# Patient Record
Sex: Male | Born: 1950 | Race: White | Hispanic: No | Marital: Married | Smoking: Never smoker
Health system: Southern US, Community
[De-identification: ages and names within clinical notes are randomized; demographics above are authoritative.]

## PROBLEM LIST (undated history)

## (undated) DIAGNOSIS — Z9289 Personal history of other medical treatment: Secondary | ICD-10-CM

## (undated) DIAGNOSIS — E669 Obesity, unspecified: Secondary | ICD-10-CM

## (undated) DIAGNOSIS — N62 Hypertrophy of breast: Secondary | ICD-10-CM

## (undated) DIAGNOSIS — Z9889 Other specified postprocedural states: Secondary | ICD-10-CM

## (undated) DIAGNOSIS — R112 Nausea with vomiting, unspecified: Secondary | ICD-10-CM

## (undated) DIAGNOSIS — E785 Hyperlipidemia, unspecified: Secondary | ICD-10-CM

## (undated) DIAGNOSIS — J302 Other seasonal allergic rhinitis: Secondary | ICD-10-CM

## (undated) DIAGNOSIS — L0390 Cellulitis, unspecified: Secondary | ICD-10-CM

## (undated) DIAGNOSIS — B9562 Methicillin resistant Staphylococcus aureus infection as the cause of diseases classified elsewhere: Secondary | ICD-10-CM

## (undated) DIAGNOSIS — N189 Chronic kidney disease, unspecified: Secondary | ICD-10-CM

## (undated) DIAGNOSIS — K5792 Diverticulitis of intestine, part unspecified, without perforation or abscess without bleeding: Secondary | ICD-10-CM

## (undated) HISTORY — PX: BREAST REDUCTION SURGERY: SHX8

## (undated) HISTORY — PX: LITHOTRIPSY: SUR834

## (undated) HISTORY — DX: Obesity, unspecified: E66.9

## (undated) HISTORY — DX: Hyperlipidemia, unspecified: E78.5

---

## 1978-02-25 HISTORY — PX: KNEE SURGERY: SHX244

## 1987-02-26 HISTORY — PX: KIDNEY STONE SURGERY: SHX686

## 2010-03-15 ENCOUNTER — Emergency Department (HOSPITAL_BASED_OUTPATIENT_CLINIC_OR_DEPARTMENT_OTHER)
Admission: EM | Admit: 2010-03-15 | Discharge: 2010-03-15 | Payer: Self-pay | Source: Home / Self Care | Admitting: Emergency Medicine

## 2010-03-19 LAB — CBC
HCT: 45 % (ref 39.0–52.0)
Hemoglobin: 15.6 g/dL (ref 13.0–17.0)
MCH: 30.3 pg (ref 26.0–34.0)
MCHC: 34.7 g/dL (ref 30.0–36.0)
MCV: 87.4 fL (ref 78.0–100.0)
Platelets: 238 10*3/uL (ref 150–400)
RBC: 5.15 MIL/uL (ref 4.22–5.81)
RDW: 13.7 % (ref 11.5–15.5)
WBC: 9.1 10*3/uL (ref 4.0–10.5)

## 2010-03-19 LAB — COMPREHENSIVE METABOLIC PANEL WITH GFR
CO2: 27 meq/L (ref 19–32)
Calcium: 9.5 mg/dL (ref 8.4–10.5)
Creatinine, Ser: 1 mg/dL (ref 0.4–1.5)
GFR calc Af Amer: >60 mL/min (ref >60)
GFR calc non Af Amer: >60 mL/min (ref >60)
Glucose, Bld: 203 mg/dL — ABNORMAL HIGH (ref 70–99)

## 2010-03-19 LAB — URINALYSIS, ROUTINE W REFLEX MICROSCOPIC
Bilirubin Urine: NEGATIVE
Ketones, ur: 15 mg/dL — AB
Nitrite: NEGATIVE
Protein, ur: NEGATIVE mg/dL
Specific Gravity, Urine: 1.021 (ref 1.005–1.030)
Urine Glucose, Fasting: NEGATIVE mg/dL
Urobilinogen, UA: 0.2 mg/dL (ref 0.0–1.0)
pH: 5.5 (ref 5.0–8.0)

## 2010-03-19 LAB — URINE MICROSCOPIC-ADD ON

## 2010-03-19 LAB — DIFFERENTIAL
Basophils Absolute: 0 10*3/uL (ref 0.0–0.1)
Basophils Relative: 0 % (ref 0–1)
Eosinophils Absolute: 0.2 10*3/uL (ref 0.0–0.7)
Eosinophils Relative: 3 % (ref 0–5)
Lymphocytes Relative: 25 % (ref 12–46)
Lymphs Abs: 2.3 10*3/uL (ref 0.7–4.0)
Monocytes Absolute: 0.8 K/uL (ref 0.1–1.0)
Monocytes Relative: 8 % (ref 3–12)
Neutro Abs: 5.8 K/uL (ref 1.7–7.7)
Neutrophils Relative %: 64 % (ref 43–77)

## 2010-03-19 LAB — COMPREHENSIVE METABOLIC PANEL
ALT: 43 U/L (ref 0–53)
AST: 29 U/L (ref 0–37)
Albumin: 4.2 g/dL (ref 3.5–5.2)
Alkaline Phosphatase: 82 U/L (ref 39–117)
BUN: 14 mg/dL (ref 6–23)
Chloride: 107 mEq/L (ref 96–112)
Potassium: 3.7 mEq/L (ref 3.5–5.1)
Sodium: 145 mEq/L (ref 135–145)
Total Bilirubin: 0.8 mg/dL (ref 0.3–1.2)
Total Protein: 7.3 g/dL (ref 6.0–8.3)

## 2010-03-19 LAB — LIPASE, BLOOD: Lipase: 93 U/L (ref 23–300)

## 2011-08-15 ENCOUNTER — Encounter: Payer: Self-pay | Admitting: *Deleted

## 2011-08-15 ENCOUNTER — Encounter: Payer: BC Managed Care – PPO | Attending: Family Medicine | Admitting: *Deleted

## 2011-08-15 DIAGNOSIS — E119 Type 2 diabetes mellitus without complications: Secondary | ICD-10-CM | POA: Insufficient documentation

## 2011-08-15 DIAGNOSIS — Z713 Dietary counseling and surveillance: Secondary | ICD-10-CM | POA: Insufficient documentation

## 2011-08-15 NOTE — Progress Notes (Signed)
  Medical Nutrition Therapy:  Appt start time: 1400 end time:  1500.   Assessment:  Primary concerns today: diabetes.   MEDICATIONS: see list   DIETARY INTAKE:  Usual eating pattern includes 1-3 meals and 3 snacks per day.  Everyday foods include pasta, bread, vegetables, nuts.  Avoided foods include sugary beverages.    24-hr recall:  B ( AM): skips 3 days; eats oatmeal, eggs, bacon, toast, hash browns, tomatoes, corn chex, yogurt and museli with berries or other fruit; coffee with dairy creamer with NNS. No more juice  Snk ( AM): nuts or candy or granola bars with water L ( PM): skips most day; may have burger and fries Snk ( PM): nuts or fruit D ( PM): pasta with meat sauce or pasta primavera with salad; roasted sweet potatoes; chicken breast with salad; rarely steak with salad; or crusty bread with salad Snk ( PM): ice cream or 2 posicles more nuts and chocolate; coffee with creamer Beverages: mineral water  Usual physical activity: none currently  Estimated energy needs: 1800 calories 200 g carbohydrates 135 g protein 50 g fat  Progress Towards Goal(s):  In progress.   Nutritional Diagnosis:  NB-1.6 Limited adherence to nutrition-related recommendations related to skipping meals and refined carbohydrates consumption.  As evidenced by obesity and HgA1C of 6.7%.    Intervention:  Nutrition counseling provided.  Patient is here with diabetes.  Has been prediabetic for awhile, but admits to not following nutrition advice and believing that he would not ever develop diabetes.  Discussed physiology of diabetes, disease progression, and role of obesity on insulin resistance.  Patient admits to not taking prescribed metformin; discussed action of metformin and it's effectiveness on glucose control.  Encouraged patient to take medications as prescribed.  Discussed carb counting and portion control.  Also discussed reading food labels, limiting dietary fats, and increasing physical  activity.  Also discussed GBM and encouraged patient to talk with physician about getting prescription for glucose strips/lancets.  Handouts given during visit include: Carb Counting and Food Label handouts Meal Plan Card  Goals: Follow Diabetes Meal Plan as instructed Eat 3 meals and 2 snacks, every 3-5 hrs Limit carbohydrate intake to 45 grams carbohydrate/meal Limit carbohydrate intake to 15 grams carbohydrate/snack Add lean protein foods to meals/snacks Talk with doctor about monitoring blood glucose Aim for 15 mins of physical activity daily Bring food record and glucose log to your next nutrition visit  Monitoring/Evaluation:  Dietary intake, exercise, GBM, and body weight in 1 month(s).  (do diabetes assessment tool)

## 2011-08-15 NOTE — Patient Instructions (Addendum)
Goals:  Follow Diabetes Meal Plan as instructed  Eat 3 meals and 2 snacks, every 3-5 hrs  Limit carbohydrate intake to 45 grams carbohydrate/meal  Limit carbohydrate intake to 15 grams carbohydrate/snack  Add lean protein foods to meals/snacks  Monitor glucose levels as instructed by your doctor  Aim for 15 mins of physical activity daily  Bring food record and glucose log to your next nutrition visit

## 2011-09-02 ENCOUNTER — Encounter (HOSPITAL_COMMUNITY): Payer: Self-pay | Admitting: Pharmacy Technician

## 2011-09-09 ENCOUNTER — Encounter (HOSPITAL_COMMUNITY)
Admission: RE | Admit: 2011-09-09 | Discharge: 2011-09-09 | Disposition: A | Discharge status: Home / Self Care | Payer: BC Managed Care – PPO | Source: Ambulatory Visit | Attending: Orthopedic Surgery | Admitting: Orthopedic Surgery

## 2011-09-09 ENCOUNTER — Encounter (HOSPITAL_COMMUNITY): Payer: Self-pay

## 2011-09-09 HISTORY — DX: Diverticulitis of intestine, part unspecified, without perforation or abscess without bleeding: K57.92

## 2011-09-09 HISTORY — DX: Cellulitis, unspecified: L03.90

## 2011-09-09 HISTORY — DX: Hypertrophy of breast: N62

## 2011-09-09 HISTORY — DX: Nausea with vomiting, unspecified: R11.2

## 2011-09-09 HISTORY — DX: Chronic kidney disease, unspecified: N18.9

## 2011-09-09 HISTORY — DX: Methicillin resistant Staphylococcus aureus infection as the cause of diseases classified elsewhere: B95.62

## 2011-09-09 HISTORY — DX: Personal history of other medical treatment: Z92.89

## 2011-09-09 HISTORY — DX: Other seasonal allergic rhinitis: J30.2

## 2011-09-09 HISTORY — DX: Other specified postprocedural states: Z98.890

## 2011-09-09 LAB — CBC
HCT: 43.8 % (ref 39.0–52.0)
Hemoglobin: 14.8 g/dL (ref 13.0–17.0)
MCH: 31.5 pg (ref 26.0–34.0)
MCHC: 33.8 g/dL (ref 30.0–36.0)
MCV: 93.2 fL (ref 78.0–100.0)
RBC: 4.7 MIL/uL (ref 4.22–5.81)

## 2011-09-09 LAB — BASIC METABOLIC PANEL
BUN: 16 mg/dL (ref 6–23)
CO2: 30 mEq/L (ref 19–32)
Calcium: 9.9 mg/dL (ref 8.4–10.5)
Creatinine, Ser: 0.79 mg/dL (ref 0.50–1.35)
GFR calc non Af Amer: >90 mL/min (ref >90)
Glucose, Bld: 104 mg/dL — ABNORMAL HIGH (ref 70–99)

## 2011-09-09 LAB — SURGICAL PCR SCREEN: MRSA, PCR: NEGATIVE

## 2011-09-09 LAB — ABO/RH: ABO/RH(D): O POS

## 2011-09-09 LAB — TYPE AND SCREEN: ABO/RH(D): O POS

## 2011-09-09 NOTE — Pre-Procedure Instructions (Addendum)
[name]  09/09/2011   Your procedure is scheduled on: Friday, July 19th.  Report to [facility] at 5:30 AM.  Call this number if you have problems the morning of surgery: [phone]   Remember:  Do not eat food:After Midnight.   Take these medicines the morning of surgery with A SIP OF WATER: May take Hydrocodone- Acetaminophern (Vicodin) if needed.   Do not wear jewelry, make-up or nail polish.  Do not wear lotions, powders, or perfumes. You may wear deodorant.  Do not shave 48 hours prior to surgery. Men may shave face and neck.  Do not bring valuables to the hospital.  Contacts, dentures or bridgework may not be worn into surgery.  Leave suitcase in the car. After surgery it may be brought to your room.  For patients admitted to the hospital, checkout time is 11:00 AM the day of discharge.   Patients discharged the day of surgery will not be allowed to drive home.  Name and phone number of your driver: NA  Special Instructions: Incentive Spirometry - Practice and bring it with you on the day of surgery. and CHG Shower Use Special Wash: 1/2 bottle night before surgery and 1/2 bottle morning of surgery.   Please read over the following fact sheets that you were given: Pain Booklet, Coughing and Deep Breathing, Blood Transfusion Information, Total Joint Packet and Surgical Site Infection Prevention

## 2011-09-10 NOTE — H&P (Signed)
CC: left knee pain HPI: [name]   With worsening left knee pain secondary to osteoarthritis. Patient has elected to have a total knee arthroplasty to decrease pain and increase function. ZOX:WRUEAVWU, hyperlipidemia Family History:non contributory Social:Dr. Clydene Pugh, non smoker, non drinker Meds:norco Allergies: NKDA ROS: Pain with ambulation Vitals:128/78 74 14 PE: Alert and appropriate 61 y/o male    In no acute distress Cranial 2-12 grossly intact Cervical spine full rom without pain Lungs: bilateral breath sounds with no wheeze rhonchi or rales Heart: regular rate and rhythm with no murmur Abd: nontender nondistended with active bowel sounds Ext: moderate pain with rom of left knee with crepitus, antalgic gait, neurovascularly intact distally. No pedal edema Skin: no rashes X-rays: endstage osteoarthritis to left knee A/P: endstage osteoarthritis to left knee Plan for total knee arthroplasty to decrease pain and increase function.

## 2011-09-12 MED ORDER — CEFAZOLIN SODIUM-DEXTROSE 2-3 GM-% IV SOLR
2.0000 g | INTRAVENOUS | Status: AC
  Administered 2011-09-13: 2 g via INTRAVENOUS
  Filled 2011-09-12: qty 50

## 2011-09-12 MED ORDER — CHLORHEXIDINE GLUCONATE 4 % EX LIQD: 60.0000 mL | Freq: Once | CUTANEOUS | Status: DC

## 2011-09-13 ENCOUNTER — Encounter (HOSPITAL_COMMUNITY): Payer: Self-pay | Admitting: *Deleted

## 2011-09-13 ENCOUNTER — Encounter (HOSPITAL_COMMUNITY)
Admission: RE | Disposition: A | Discharge status: Home Health | Payer: Self-pay | Source: Ambulatory Visit | Attending: Orthopedic Surgery

## 2011-09-13 ENCOUNTER — Ambulatory Visit (HOSPITAL_COMMUNITY): Payer: BC Managed Care – PPO | Admitting: Anesthesiology

## 2011-09-13 ENCOUNTER — Ambulatory Visit (HOSPITAL_COMMUNITY): Payer: BC Managed Care – PPO

## 2011-09-13 ENCOUNTER — Inpatient Hospital Stay (HOSPITAL_COMMUNITY): Payer: BC Managed Care – PPO

## 2011-09-13 ENCOUNTER — Encounter (HOSPITAL_COMMUNITY): Payer: Self-pay | Admitting: Anesthesiology

## 2011-09-13 ENCOUNTER — Inpatient Hospital Stay (HOSPITAL_COMMUNITY)
Admission: RE | Admit: 2011-09-13 | Discharge: 2011-09-16 | DRG: 209 | Disposition: A | Discharge status: Home Health | Payer: BC Managed Care – PPO | Source: Ambulatory Visit | Attending: Orthopedic Surgery | Admitting: Orthopedic Surgery

## 2011-09-13 DIAGNOSIS — Z96659 Presence of unspecified artificial knee joint: Secondary | ICD-10-CM

## 2011-09-13 DIAGNOSIS — M1710 Unilateral primary osteoarthritis, unspecified knee: Principal | ICD-10-CM | POA: Diagnosis present

## 2011-09-13 DIAGNOSIS — E119 Type 2 diabetes mellitus without complications: Secondary | ICD-10-CM | POA: Diagnosis present

## 2011-09-13 DIAGNOSIS — E785 Hyperlipidemia, unspecified: Secondary | ICD-10-CM | POA: Diagnosis present

## 2011-09-13 HISTORY — PX: TOTAL KNEE ARTHROPLASTY: SHX125

## 2011-09-13 LAB — APTT: aPTT: 30 seconds (ref 24–37)

## 2011-09-13 LAB — GLUCOSE, CAPILLARY
Glucose-Capillary: 141 mg/dL — ABNORMAL HIGH (ref 70–99)
Glucose-Capillary: 135 mg/dL — ABNORMAL HIGH (ref 70–99)
Glucose-Capillary: 137 mg/dL — ABNORMAL HIGH (ref 70–99)

## 2011-09-13 SURGERY — ARTHROPLASTY, KNEE, TOTAL
Anesthesia: Regional | Site: Knee | Laterality: Left | Wound class: Clean

## 2011-09-13 MED ORDER — LACTATED RINGERS IV SOLN
INTRAVENOUS | Status: DC | PRN
  Administered 2011-09-13 (×2): via INTRAVENOUS

## 2011-09-13 MED ORDER — CEFAZOLIN SODIUM-DEXTROSE 2-3 GM-% IV SOLR
2.0000 g | Freq: Four times a day (QID) | INTRAVENOUS | Status: AC
  Administered 2011-09-13 (×2): 2 g via INTRAVENOUS
  Filled 2011-09-13 (×3): qty 50

## 2011-09-13 MED ORDER — ACETAMINOPHEN 325 MG PO TABS
650.0000 mg | ORAL_TABLET | Freq: Four times a day (QID) | ORAL | Status: DC | PRN
  Administered 2011-09-16 (×2): 650 mg via ORAL
  Filled 2011-09-13 (×3): qty 2

## 2011-09-13 MED ORDER — LIDOCAINE HCL (CARDIAC) 20 MG/ML IV SOLN
INTRAVENOUS | Status: DC | PRN
  Administered 2011-09-13: 30 mg via INTRAVENOUS

## 2011-09-13 MED ORDER — MENTHOL 3 MG MT LOZG: 1.0000 | LOZENGE | OROMUCOSAL | Status: DC | PRN

## 2011-09-13 MED ORDER — FENTANYL CITRATE 0.05 MG/ML IJ SOLN
50.0000 ug | INTRAMUSCULAR | Status: DC | PRN
  Administered 2011-09-13: 100 ug via INTRAVENOUS

## 2011-09-13 MED ORDER — TESTOSTERONE 50 MG/5GM (1%) TD GEL: 5.0000 g | Freq: Every day | TRANSDERMAL | Status: DC

## 2011-09-13 MED ORDER — ALBUTEROL SULFATE HFA 108 (90 BASE) MCG/ACT IN AERS
2.0000 | INHALATION_SPRAY | Freq: Four times a day (QID) | RESPIRATORY_TRACT | Status: DC | PRN
  Filled 2011-09-13: qty 6.7

## 2011-09-13 MED ORDER — CELECOXIB 200 MG PO CAPS
200.0000 mg | ORAL_CAPSULE | Freq: Two times a day (BID) | ORAL | Status: DC
  Administered 2011-09-13 – 2011-09-16 (×6): 200 mg via ORAL
  Filled 2011-09-13 (×8): qty 1

## 2011-09-13 MED ORDER — SCOPOLAMINE 1 MG/3DAYS TD PT72
1.0000 | MEDICATED_PATCH | TRANSDERMAL | Status: DC
  Administered 2011-09-13: 1.5 mg via TRANSDERMAL

## 2011-09-13 MED ORDER — HYDROMORPHONE HCL PF 1 MG/ML IJ SOLN
0.2500 mg | INTRAMUSCULAR | Status: DC | PRN
  Administered 2011-09-13 (×4): 0.5 mg via INTRAVENOUS

## 2011-09-13 MED ORDER — METHOCARBAMOL 500 MG PO TABS
500.0000 mg | ORAL_TABLET | Freq: Four times a day (QID) | ORAL | Status: DC | PRN
  Administered 2011-09-13 – 2011-09-16 (×10): 500 mg via ORAL
  Filled 2011-09-13 (×11): qty 1

## 2011-09-13 MED ORDER — ACETAMINOPHEN 650 MG RE SUPP: 650.0000 mg | Freq: Four times a day (QID) | RECTAL | Status: DC | PRN

## 2011-09-13 MED ORDER — BUPIVACAINE-EPINEPHRINE PF 0.5-1:200000 % IJ SOLN
INTRAMUSCULAR | Status: DC | PRN
  Administered 2011-09-13: 30 mL

## 2011-09-13 MED ORDER — WARFARIN VIDEO
Freq: Once | Status: AC
  Administered 2011-09-14: 08:00:00

## 2011-09-13 MED ORDER — MIDAZOLAM HCL 2 MG/2ML IJ SOLN: 0.5000 mg | Freq: Once | INTRAMUSCULAR | Status: DC | PRN

## 2011-09-13 MED ORDER — WARFARIN SODIUM 7.5 MG PO TABS
7.5000 mg | ORAL_TABLET | Freq: Once | ORAL | Status: AC
  Administered 2011-09-13: 7.5 mg via ORAL
  Filled 2011-09-13: qty 1

## 2011-09-13 MED ORDER — ONDANSETRON HCL 4 MG PO TABS: 4.0000 mg | ORAL_TABLET | Freq: Four times a day (QID) | ORAL | Status: DC | PRN

## 2011-09-13 MED ORDER — FENTANYL CITRATE 0.05 MG/ML IJ SOLN
INTRAMUSCULAR | Status: DC | PRN
  Administered 2011-09-13: 50 ug via INTRAVENOUS
  Administered 2011-09-13: 100 ug via INTRAVENOUS
  Administered 2011-09-13: 50 ug via INTRAVENOUS
  Administered 2011-09-13: 100 ug via INTRAVENOUS

## 2011-09-13 MED ORDER — LACTATED RINGERS IV SOLN
INTRAVENOUS | Status: DC
  Administered 2011-09-13: 10:00:00 via INTRAVENOUS

## 2011-09-13 MED ORDER — WARFARIN - PHARMACIST DOSING INPATIENT: Freq: Every day | Status: DC

## 2011-09-13 MED ORDER — POLYVINYL ALCOHOL 1.4 % OP SOLN
2.0000 [drp] | Freq: Three times a day (TID) | OPHTHALMIC | Status: DC
  Administered 2011-09-13 – 2011-09-16 (×6): 2 [drp] via OPHTHALMIC
  Filled 2011-09-13: qty 15

## 2011-09-13 MED ORDER — MIDAZOLAM HCL 2 MG/2ML IJ SOLN
1.0000 mg | INTRAMUSCULAR | Status: DC | PRN
  Administered 2011-09-13: 2 mg via INTRAVENOUS

## 2011-09-13 MED ORDER — PROMETHAZINE HCL 25 MG/ML IJ SOLN: 6.2500 mg | INTRAMUSCULAR | Status: DC | PRN

## 2011-09-13 MED ORDER — SODIUM CHLORIDE 0.9 % IR SOLN
Status: DC | PRN
  Administered 2011-09-13: 4000 mL

## 2011-09-13 MED ORDER — MEPERIDINE HCL 25 MG/ML IJ SOLN: 6.2500 mg | INTRAMUSCULAR | Status: DC | PRN

## 2011-09-13 MED ORDER — ONDANSETRON HCL 4 MG/2ML IJ SOLN: 4.0000 mg | Freq: Four times a day (QID) | INTRAMUSCULAR | Status: DC | PRN

## 2011-09-13 MED ORDER — HYDROMORPHONE HCL PF 1 MG/ML IJ SOLN
INTRAMUSCULAR | Status: AC
  Filled 2011-09-13: qty 1

## 2011-09-13 MED ORDER — METOCLOPRAMIDE HCL 10 MG PO TABS: 5.0000 mg | ORAL_TABLET | Freq: Three times a day (TID) | ORAL | Status: DC | PRN

## 2011-09-13 MED ORDER — PHENOL 1.4 % MT LIQD: 1.0000 | OROMUCOSAL | Status: DC | PRN

## 2011-09-13 MED ORDER — METOPROLOL TARTRATE 1 MG/ML IV SOLN
INTRAVENOUS | Status: DC | PRN
  Administered 2011-09-13 (×5): 1 mg via INTRAVENOUS

## 2011-09-13 MED ORDER — ONDANSETRON HCL 4 MG/2ML IJ SOLN
INTRAMUSCULAR | Status: DC | PRN
  Administered 2011-09-13: 4 mg via INTRAVENOUS

## 2011-09-13 MED ORDER — METHOCARBAMOL 100 MG/ML IJ SOLN
500.0000 mg | Freq: Four times a day (QID) | INTRAVENOUS | Status: DC | PRN
  Filled 2011-09-13: qty 5

## 2011-09-13 MED ORDER — PROPOFOL 10 MG/ML IV EMUL
INTRAVENOUS | Status: DC | PRN
  Administered 2011-09-13: 200 mg via INTRAVENOUS

## 2011-09-13 MED ORDER — PATIENT'S GUIDE TO USING COUMADIN BOOK
Freq: Once | Status: AC
  Administered 2011-09-13: 1
  Filled 2011-09-13: qty 1

## 2011-09-13 MED ORDER — POLYETHYL GLYCOL-PROPYL GLYCOL 0.4-0.3 % OP SOLN: 2.0000 [drp] | Freq: Three times a day (TID) | OPHTHALMIC | Status: DC

## 2011-09-13 MED ORDER — FENTANYL CITRATE 0.05 MG/ML IJ SOLN
INTRAMUSCULAR | Status: AC
  Filled 2011-09-13: qty 2

## 2011-09-13 MED ORDER — METOCLOPRAMIDE HCL 5 MG/ML IJ SOLN: 5.0000 mg | Freq: Three times a day (TID) | INTRAMUSCULAR | Status: DC | PRN

## 2011-09-13 MED ORDER — BISACODYL 10 MG RE SUPP: 10.0000 mg | Freq: Every day | RECTAL | Status: DC | PRN

## 2011-09-13 MED ORDER — MIDAZOLAM HCL 2 MG/2ML IJ SOLN
INTRAMUSCULAR | Status: AC
  Filled 2011-09-13: qty 2

## 2011-09-13 MED ORDER — OXYCODONE HCL 5 MG PO TABS
5.0000 mg | ORAL_TABLET | ORAL | Status: DC | PRN
  Administered 2011-09-13 – 2011-09-16 (×18): 10 mg via ORAL
  Filled 2011-09-13 (×19): qty 2

## 2011-09-13 MED ORDER — MIDAZOLAM HCL 5 MG/5ML IJ SOLN
INTRAMUSCULAR | Status: DC | PRN
  Administered 2011-09-13: 2 mg via INTRAVENOUS

## 2011-09-13 MED ORDER — HYDROMORPHONE HCL PF 1 MG/ML IJ SOLN
1.0000 mg | INTRAMUSCULAR | Status: DC | PRN
  Administered 2011-09-13 – 2011-09-14 (×2): 1 mg via INTRAVENOUS
  Filled 2011-09-13 (×2): qty 1

## 2011-09-13 SURGICAL SUPPLY — 60 items
BANDAGE ELASTIC 4 VELCRO ST LF (GAUZE/BANDAGES/DRESSINGS) IMPLANT
BANDAGE ESMARK 6X9 LF (GAUZE/BANDAGES/DRESSINGS) ×1 IMPLANT
BANDAGE GAUZE ELAST BULKY 4 IN (GAUZE/BANDAGES/DRESSINGS) ×2 IMPLANT
BLADE SAG 18X100X1.27 (BLADE) ×2 IMPLANT
BLADE SAW SGTL 13.0X1.19X90.0M (BLADE) ×2 IMPLANT
BNDG ELASTIC 6X10 VLCR STRL LF (GAUZE/BANDAGES/DRESSINGS) IMPLANT
BNDG ESMARK 6X9 LF (GAUZE/BANDAGES/DRESSINGS) ×2
BOWL SMART MIX CTS (DISPOSABLE) ×2 IMPLANT
CEMENT HV SMART SET (Cement) ×4 IMPLANT
CLOTH BEACON ORANGE TIMEOUT ST (SAFETY) ×2 IMPLANT
CLSR STERI-STRIP ANTIMIC 1/2X4 (GAUZE/BANDAGES/DRESSINGS) ×4 IMPLANT
COVER BACK TABLE 24X17X13 BIG (DRAPES) IMPLANT
COVER SURGICAL LIGHT HANDLE (MISCELLANEOUS) ×2 IMPLANT
CUFF TOURNIQUET SINGLE 34IN LL (TOURNIQUET CUFF) ×2 IMPLANT
CUFF TOURNIQUET SINGLE 44IN (TOURNIQUET CUFF) IMPLANT
DRAPE EXTREMITY T 121X128X90 (DRAPE) ×2 IMPLANT
DRAPE PROXIMA HALF (DRAPES) ×2 IMPLANT
DRAPE U-SHAPE 47X51 STRL (DRAPES) ×2 IMPLANT
DRSG ADAPTIC 3X8 NADH LF (GAUZE/BANDAGES/DRESSINGS) IMPLANT
DRSG PAD ABDOMINAL 8X10 ST (GAUZE/BANDAGES/DRESSINGS) ×2 IMPLANT
DURAPREP 26ML APPLICATOR (WOUND CARE) ×2 IMPLANT
ELECT CAUTERY BLADE 6.4 (BLADE) ×2 IMPLANT
ELECT REM PT RETURN 9FT ADLT (ELECTROSURGICAL) ×2
ELECTRODE REM PT RTRN 9FT ADLT (ELECTROSURGICAL) ×1 IMPLANT
FACESHIELD LNG OPTICON STERILE (SAFETY) IMPLANT
GLOVE BIOGEL PI ORTHO PRO 7.5 (GLOVE) ×1
GLOVE BIOGEL PI ORTHO PRO SZ8 (GLOVE) ×1
GLOVE EUDERMIC 7 POWDERFREE (GLOVE) ×2 IMPLANT
GLOVE ORTHO TXT STRL SZ7.5 (GLOVE) ×2 IMPLANT
GLOVE PI ORTHO PRO STRL 7.5 (GLOVE) ×1 IMPLANT
GLOVE PI ORTHO PRO STRL SZ8 (GLOVE) ×1 IMPLANT
GLOVE SURG ORTHO 8.5 STRL (GLOVE) ×2 IMPLANT
GLOVE SURG SS PI 7.0 STRL IVOR (GLOVE) ×2 IMPLANT
GOWN STRL NON-REIN LRG LVL3 (GOWN DISPOSABLE) ×2 IMPLANT
GOWN STRL REIN 2XL LVL4 (GOWN DISPOSABLE) ×2 IMPLANT
GOWN STRL REIN XL XLG (GOWN DISPOSABLE) ×6 IMPLANT
HANDPIECE INTERPULSE COAX TIP (DISPOSABLE) ×1
IMMOBILIZER KNEE 22 UNIV (SOFTGOODS) ×2 IMPLANT
KIT BASIN OR (CUSTOM PROCEDURE TRAY) ×2 IMPLANT
KIT MANIFOLD (MISCELLANEOUS) ×2 IMPLANT
KIT ROOM TURNOVER OR (KITS) ×2 IMPLANT
MANIFOLD NEPTUNE II (INSTRUMENTS) ×2 IMPLANT
NS IRRIG 1000ML POUR BTL (IV SOLUTION) ×2 IMPLANT
PACK TOTAL JOINT (CUSTOM PROCEDURE TRAY) ×2 IMPLANT
PAD ARMBOARD 7.5X6 YLW CONV (MISCELLANEOUS) ×2 IMPLANT
SET HNDPC FAN SPRY TIP SCT (DISPOSABLE) ×1 IMPLANT
SPONGE GAUZE 4X4 12PLY (GAUZE/BANDAGES/DRESSINGS) IMPLANT
STRIP CLOSURE SKIN 1/2X4 (GAUZE/BANDAGES/DRESSINGS) IMPLANT
SUCTION FRAZIER TIP 10 FR DISP (SUCTIONS) ×2 IMPLANT
SUT MNCRL AB 3-0 PS2 18 (SUTURE) ×2 IMPLANT
SUT VIC AB 0 CT1 27 (SUTURE) ×2
SUT VIC AB 0 CT1 27XBRD ANBCTR (SUTURE) ×2 IMPLANT
SUT VIC AB 1 CT1 27 (SUTURE) ×2
SUT VIC AB 1 CT1 27XBRD ANBCTR (SUTURE) ×2 IMPLANT
SUT VIC AB 2-0 CT1 27 (SUTURE) ×2
SUT VIC AB 2-0 CT1 TAPERPNT 27 (SUTURE) ×2 IMPLANT
TOWEL OR 17X24 6PK STRL BLUE (TOWEL DISPOSABLE) ×2 IMPLANT
TOWEL OR 17X26 10 PK STRL BLUE (TOWEL DISPOSABLE) ×2 IMPLANT
TRAY FOLEY CATH 14FR (SET/KITS/TRAYS/PACK) ×2 IMPLANT
WATER STERILE IRR 1000ML POUR (IV SOLUTION) ×4 IMPLANT

## 2011-09-13 NOTE — Transfer of Care (Signed)
Immediate Anesthesia Transfer of Care Note  Patient: Steven Schneider  Procedure(s) Performed: Procedure(s) (LRB): TOTAL KNEE ARTHROPLASTY (Left)  Patient Location: PACU  Anesthesia Type: General  Level of Consciousness: awake, alert  and oriented  Airway & Oxygen Therapy: Patient Spontanous Breathing and Patient connected to nasal cannula oxygen  Post-op Assessment: Report given to PACU RN, Post -op Vital signs reviewed and stable and Patient moving all extremities  Post vital signs: Reviewed and stable  Complications: No apparent anesthesia complications

## 2011-09-13 NOTE — Progress Notes (Signed)
UR COMPLETED  

## 2011-09-13 NOTE — Interval H&P Note (Signed)
History and Physical Interval Note:  09/13/2011 9:39 AM  Steven Schneider  has presented today for surgery, with the diagnosis of LEFT KNEE OA  The various methods of treatment have been discussed with the patient and family. After consideration of risks, benefits and other options for treatment, the patient has consented to  Procedure(s) (LRB): TOTAL KNEE ARTHROPLASTY (Left) as a surgical intervention .  The patient's history has been reviewed, patient examined, no change in status, stable for surgery.  I have reviewed the patient's chart and labs.  Questions were answered to the patient's satisfaction.     Steven Schneider,STEVEN R

## 2011-09-13 NOTE — Preoperative (Signed)
Beta Blockers   Reason not to administer Beta Blockers:Not Applicable 

## 2011-09-13 NOTE — Anesthesia Preprocedure Evaluation (Signed)
Anesthesia Evaluation  Patient identified by MRN, date of birth, ID band Patient awake    Reviewed: Allergy & Precautions, H&P , NPO status , Patient's Chart, lab work & pertinent test results  History of Anesthesia Complications (+) PONV  Airway Mallampati: II TM Distance: <3 FB Neck ROM: Full    Dental  (+) Teeth Intact, Caps and Dental Advisory Given   Pulmonary asthma (last needed inhaler 6 months ago) , sleep apnea (possibly? 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   Dental advisory given  Plan Discussed with: CRNA and Surgeon  Anesthesia Plan Comments: (Plan routine monitors, GA- LMA OK, femoral nerve block for post op analgesia)        Anesthesia Quick Evaluation

## 2011-09-13 NOTE — Progress Notes (Signed)
ANTICOAGULATION CONSULT NOTE - Initial Consult  Pharmacy Consult for coumadin Indication: VTE prophylaxis  No Known Allergies  Patient Measurements: Height: 5' 8.11" (173 cm) Weight: 262 lb 5.6 oz (119 kg) IBW/kg (Calculated) : 68.65  Heparin Dosing Weight:   Vital Signs: Temp: 97.4 F (36.3 C) (07/19 1533) Temp src: Oral (07/19 0828) BP: 111/80 mmHg (07/19 1533) Pulse Rate: 96  (07/19 1533)  Labs:  Basename 09/13/11 0850  HGB --  HCT --  PLT --  APTT 30  LABPROT 12.6  INR 0.92  HEPARINUNFRC --  CREATININE --  CKTOTAL --  CKMB --  TROPONINI --    Estimated Creatinine Clearance: 123.3 ml/min (by C-G formula based on Cr of 0.79).   Medical History: Past Medical History  Diagnosis Date  . Diabetes mellitus   . Hyperlipidemia   . Obesity   . PONV (postoperative nausea and vomiting)   . Chronic kidney disease     Kidney Stones- one in Left kidney now.  . Diverticulitis   . History of blood transfusion   . MRSA cellulitis   . Gynecomastia   . Seasonal allergies     Medications:  Scheduled:    .  ceFAZolin (ANCEF) IV  2 g Intravenous 60 min Pre-Op  .  ceFAZolin (ANCEF) IV  2 g Intravenous Q6H  . celecoxib  200 mg Oral Q12H  . HYDROmorphone      . HYDROmorphone      . polyvinyl alcohol  2 drop Both Eyes TID  . testosterone  5 g Transdermal Daily  . DISCONTD: chlorhexidine  60 mL Topical Once  . DISCONTD: Polyethyl Glycol-Propyl Glycol  2 drop Both Eyes TID  . DISCONTD: scopolamine  1 patch Transdermal Q72H    Assessment: 60 YOM s/p Left TKA on 7/19.  Orders to start warfarin for VTE prophylaxis, Baseline labs reveal INR=0.92, Hgb=14.8, platelets=222  Goal of Therapy:  INR 2-3 Monitor platelets by anticoagulation protocol: Yes   Plan:  1. Coumadin 7.5mg  PO x 1 tonight 2. Daily INR 3. Monitor for bleeding 4. Coumadin education materials  Dannielle Huh 09/13/2011,4:10 PM

## 2011-09-13 NOTE — Progress Notes (Signed)
Orthopedic Tech Progress Note Patient Details:  Steven Schneider [birth year] [id number] Applied overhead frame and trapeze bar. CPM Left Knee CPM Left Knee: On Left Knee Flexion (Degrees): 60  Left Knee Extension (Degrees): 0    Jennye Moccasin 09/13/2011, 2:54 PM

## 2011-09-13 NOTE — Anesthesia Procedure Notes (Addendum)
Anesthesia Regional Block:  Femoral nerve block  Pre-Anesthetic Checklist: ,, timeout performed, Correct Patient, Correct Site, Correct Laterality, Correct Procedure, Correct Position, site marked, Risks and benefits discussed,  Surgical consent,  Pre-op evaluation,  At surgeon's request and post-op pain management  Laterality: Left  Prep: chloraprep       Needles:  Injection technique: Single-shot  Needle Type: Stimulator Needle - 40     Needle Length: 4cm  Needle Gauge: 22 and 22 G    Additional Needles:  Procedures: nerve stimulator Femoral nerve block  Nerve Stimulator or Paresthesia:  Response: patella twitch, 0.45 mA, 0.1 ms,   Additional Responses:   Narrative:  Start time: 09/13/2011 9:49 AM End time: 09/13/2011 9:53 AM Injection made incrementally with aspirations every 5 mL.  Performed by: Personally  Anesthesiologist: Sandford Craze, MD  Additional Notes: Pt identified in Holding room.  Monitors applied. Working IV access confirmed. Sterile prep L knee.  #22ga PNS to patella twitch at 0.89mA threshold.  30cc 0.5% Bupivacaine with 1:200k epi injected incrementally after negative test dose.  Patient asymptomatic, VSS, no heme aspirated, tolerated well.   Sandford Craze, MD   Procedure Name: LMA Insertion Date/Time: 09/13/2011 10:27 AM Performed by: Rossie Muskrat L Pre-anesthesia Checklist: Patient identified, Timeout performed, Emergency Drugs available, Suction available and Patient being monitored Patient Re-evaluated:Patient Re-evaluated prior to inductionOxygen Delivery Method: Circle system utilized Preoxygenation: Pre-oxygenation with 100% oxygen Intubation Type: IV induction Ventilation: Mask ventilation without difficulty LMA: LMA inserted LMA Size: 4.0 Tube type: Oral Number of attempts: 1 Airway Equipment and Method: Stylet Placement Confirmation: breath sounds checked- equal and bilateral and positive ETCO2 Tube secured with: Tape Dental Injury: Teeth  and Oropharynx as per pre-operative assessment

## 2011-09-13 NOTE — Brief Op Note (Signed)
09/13/2011  12:20 PM  PATIENT:  Steven Schneider  61 y.o. male  PRE-OPERATIVE DIAGNOSIS:  LEFT KNEE OA, End Staged  POST-OPERATIVE DIAGNOSIS:  LEFT KNEE OA, End Staged  PROCEDURE:  Procedure(s) (LRB): TOTAL KNEE ARTHROPLASTY (Left) - DePuy Sigma RP  SURGEON:  Surgeon(s) and Role:    * Verlee Rossetti, MD - Primary  PHYSICIAN ASSISTANT:   ASSISTANTS: Thea Gist, PA-C   ANESTHESIA:   regional and general  EBL:  Total I/O In: 1000 [I.V.:1000] Out: -   BLOOD ADMINISTERED:none  DRAINS: none   LOCAL MEDICATIONS USED:  NONE  SPECIMEN:  No Specimen  DISPOSITION OF SPECIMEN:  N/A  COUNTS:  YES  TOURNIQUET:  * Missing tourniquet times found for documented tourniquets in log:  [id number] *  DICTATION: .Other Dictation: Dictation Number [id number]  PLAN OF CARE: Admit to inpatient   PATIENT DISPOSITION:  PACU - hemodynamically stable.   Delay start of Pharmacological VTE agent (>24hrs) due to surgical blood loss or risk of bleeding: not applicable

## 2011-09-13 NOTE — Anesthesia Postprocedure Evaluation (Signed)
Anesthesia Post Note  Patient: Steven Schneider  Procedure(s) Performed: Procedure(s) (LRB): TOTAL KNEE ARTHROPLASTY (Left)  Anesthesia type: general  Patient location: PACU  Post pain: Pain level controlled  Post assessment: Patient's Cardiovascular Status Stable  Last Vitals:  Filed Vitals:   09/13/11 1507  BP: 108/70  Pulse:   Temp:   Resp:     Post vital signs: Reviewed and stable  Level of consciousness: sedated  Complications: No apparent anesthesia complications

## 2011-09-14 LAB — CBC
HCT: 35.5 % — ABNORMAL LOW (ref 39.0–52.0)
MCH: 31.6 pg (ref 26.0–34.0)
MCHC: 33.5 g/dL (ref 30.0–36.0)
MCV: 94.2 fL (ref 78.0–100.0)
Platelets: 178 10*3/uL (ref 150–400)
RDW: 13.5 % (ref 11.5–15.5)

## 2011-09-14 LAB — BASIC METABOLIC PANEL
BUN: 10 mg/dL (ref 6–23)
Calcium: 8.7 mg/dL (ref 8.4–10.5)
Chloride: 100 mEq/L (ref 96–112)
Creatinine, Ser: 0.81 mg/dL (ref 0.50–1.35)
GFR calc Af Amer: >90 mL/min (ref >90)

## 2011-09-14 MED ORDER — WARFARIN SODIUM 7.5 MG PO TABS
7.5000 mg | ORAL_TABLET | Freq: Once | ORAL | Status: AC
  Administered 2011-09-14: 7.5 mg via ORAL
  Filled 2011-09-14: qty 1

## 2011-09-14 NOTE — Progress Notes (Signed)
Physical Therapy Treatment Patient Details Name: Steven Schneider MRN: [id number] DOB: [birth year] Today's Date: 09/14/2011 Time: 7829-5621 PT Time Calculation (min): 16 min  PT Assessment / Plan / Recommendation Comments on Treatment Session  pt presents with L TKA.  pt movign well, making progress.  pt notes pain meds due ~24mins after done with PT and discussed doing CPM after meds given.      Follow Up Recommendations  Home health PT;Supervision - Intermittent    Barriers to Discharge        Equipment Recommendations  Rolling walker with 5" wheels;3 in 1 bedside comode    Recommendations for Other Services    Frequency 7X/week   Plan Discharge plan remains appropriate;Frequency remains appropriate    Precautions / Restrictions Precautions Precautions: Fall Required Braces or Orthoses: Knee Immobilizer - Left Knee Immobilizer - Left: On when out of bed or walking (with ambulation) Restrictions Weight Bearing Restrictions: Yes LLE Weight Bearing: Weight bearing as tolerated   Pertinent Vitals/Pain 6/10    Mobility  Bed Mobility Bed Mobility: Supine to Sit;Sitting - Scoot to Edge of Bed;Sit to Supine Supine to Sit: 4: Min assist Sitting - Scoot to Edge of Bed: 4: Min assist Sit to Supine: 4: Min assist Details for Bed Mobility Assistance: A with L LE only.   Transfers Transfers: Sit to Stand;Stand to Sit Sit to Stand: 4: Min guard;With upper extremity assist;From bed Stand to Sit: 4: Min guard;With upper extremity assist;To bed Details for Transfer Assistance: demos good technique this pm.   Ambulation/Gait Ambulation/Gait Assistance: 4: Min guard Ambulation Distance (Feet): 100 Feet Assistive device: Rolling walker Ambulation/Gait Assistance Details: cues for positioning in RW, upright posture, sequencing Gait Pattern: Step-to pattern;Decreased step length - right;Decreased stance time - left Stairs: No Wheelchair Mobility Wheelchair Mobility: No    Exercises      PT Diagnosis:    PT Problem List:   PT Treatment Interventions:     PT Goals Acute Rehab PT Goals Time For Goal Achievement: 09/21/11 PT Goal: Supine/Side to Sit - Progress: Progressing toward goal PT Goal: Sit to Supine/Side - Progress: Progressing toward goal PT Goal: Sit to Stand - Progress: Progressing toward goal PT Goal: Stand to Sit - Progress: Progressing toward goal PT Goal: Ambulate - Progress: Progressing toward goal  Visit Information  Last PT Received On: 09/14/11 Assistance Needed: +1    Subjective Data  Subjective: I've been able to walk into the bathroom.     Cognition  Overall Cognitive Status: Appears within functional limits for tasks assessed/performed Arousal/Alertness: Awake/alert Orientation Level: Oriented X4 / Intact Behavior During Session: [facility] for tasks performed    Balance  Balance Balance Assessed: No  End of Session PT - End of Session Equipment Utilized During Treatment: Gait belt;Left knee immobilizer Activity Tolerance: Patient tolerated treatment well Patient left: in bed;with call bell/phone within reach;with family/visitor present Nurse Communication: Mobility status   GP     Sunny Schlein, [place] [phone] 09/14/2011, 3:20 PM

## 2011-09-14 NOTE — Progress Notes (Signed)
Orthopedics Progress Note  Subjective: Pt doing well, mild pain to left knee today  Objective:  Filed Vitals:   09/14/11 0651  BP: 158/87  Pulse: 105  Temp: 98.4 F (36.9 C)  Resp: 16    General: Awake and alert  Musculoskeletal: left knee dressing intact, nv intact distally Neurovascularly intact  Lab Results  Component Value Date   WBC 9.8 09/14/2011   HGB 11.9* 09/14/2011   HCT 35.5* 09/14/2011   MCV 94.2 09/14/2011   PLT 178 09/14/2011       Component Value Date/Time   NA 137 09/14/2011 0630   K 3.9 09/14/2011 0630   CL 100 09/14/2011 0630   CO2 28 09/14/2011 0630   GLUCOSE 161* 09/14/2011 0630   BUN 10 09/14/2011 0630   CREATININE 0.81 09/14/2011 0630   CALCIUM 8.7 09/14/2011 0630   GFRNONAA >90 09/14/2011 0630   GFRAA >90 09/14/2011 0630    Lab Results  Component Value Date   INR 1.16 09/14/2011   INR 0.92 09/13/2011    Assessment/Plan: POD #1 s/p Procedure(s):left  TOTAL KNEE ARTHROPLASTY  PT/OT D/c planning for probably Monday Pain control as needed  Viviann Spare R. Ranell Patrick, MD 09/14/2011 8:11 AM

## 2011-09-14 NOTE — Progress Notes (Signed)
Occupational Therapy Evaluation Patient Details Name: Steven Schneider MRN: [id number] DOB: [birth year] Today's Date: 09/14/2011 Time: 7829-5621 OT Time Calculation (min): 15 min  OT Assessment / Plan / Recommendation Clinical Impression  61 yo s/p L TKA. PT will benefit from skilled OT services to max independence and  safety with ADL and functional mobility for ADL to facilitate D/C home with 24/7 S of wife.    OT Assessment  Patient needs continued OT Services    Follow Up Recommendations  No OT follow up    Barriers to Discharge None    Equipment Recommendations  Tub/shower bench    Recommendations for Other Services    Frequency  Min 2X/week    Precautions / Restrictions Precautions Precautions: Fall Required Braces or Orthoses: Knee Immobilizer - Left Knee Immobilizer - Left: On when out of bed or walking Restrictions Weight Bearing Restrictions: Yes LLE Weight Bearing: Weight bearing as tolerated   Pertinent Vitals/Pain 5    ADL  Eating/Feeding: Performed;Independent Where Assessed - Eating/Feeding: Bed level Grooming: Performed;Supervision/safety Where Assessed - Grooming: Supported standing Upper Body Bathing: Simulated;Set up Where Assessed - Upper Body Bathing: Unsupported sitting Lower Body Bathing: Simulated;Maximal assistance Where Assessed - Lower Body Bathing: Supported sit to stand Upper Body Dressing: Simulated;Set up Where Assessed - Upper Body Dressing: Unsupported sitting Lower Body Dressing: Simulated;Maximal assistance Where Assessed - Lower Body Dressing: Supported sit to stand Toilet Transfer: Performed;Minimal assistance Toilet Transfer Method: Sit to Barista: Materials engineer and Hygiene: Performed;Minimal assistance Where Assessed - Engineer, mining and Hygiene: Standing Transfers/Ambulation Related to ADLs: min guard ADL Comments: Will benefit from AE and tub bench    OT Diagnosis: Generalized weakness;Acute pain  OT Problem List: Decreased strength;Decreased range of motion;Decreased knowledge of use of DME or AE;Decreased knowledge of precautions;Obesity;Pain OT Treatment Interventions: Self-care/ADL training;Energy conservation;DME and/or AE instruction;Therapeutic activities;Patient/family education   OT Goals Acute Rehab OT Goals OT Goal Formulation: With patient Time For Goal Achievement: 09/21/11 Potential to Achieve Goals: Good ADL Goals Pt Will Perform Lower Body Bathing: with supervision;with caregiver independent in assisting;Sit to stand from chair;Unsupported;with adaptive equipment ADL Goal: Lower Body Bathing - Progress: Goal set today Pt Will Perform Lower Body Dressing: with supervision;with caregiver independent in assisting;Sit to stand from chair;Unsupported;with adaptive equipment ADL Goal: Lower Body Dressing - Progress: Goal set today Pt Will Transfer to Toilet: with supervision;with caregiver independent in assisting;with DME;3-in-1 ADL Goal: Toilet Transfer - Progress: Goal set today Pt Will Perform Toileting - Clothing Manipulation: with supervision;with caregiver independent in assisting;Standing ADL Goal: Toileting - Clothing Manipulation - Progress: Goal set today Pt Will Perform Toileting - Hygiene: with modified independence;Sit to stand from 3-in-1/toilet ADL Goal: Toileting - Hygiene - Progress: Goal set today Pt Will Perform Tub/Shower Transfer: with supervision;with caregiver independent in assisting;Ambulation;with DME;Transfer tub bench ADL Goal: Tub/Shower Transfer - Progress: Goal set today  Visit Information  Last OT Received On: 09/14/11 Assistance Needed: +1    Subjective Data      Prior Functioning  Vision/Perception  Home Living Lives With: Spouse Available Help at Discharge: Family;Available 24 hours/day Type of Home: House Home Access: Stairs to enter [employer] of Steps: 2 Entrance  Stairs-Rails: None Home Layout: One level Bathroom Shower/Tub: Forensic scientist: Standard Bathroom Accessibility: Yes How Accessible: Accessible via walker Home Adaptive Equipment: Straight cane Prior Function Level of Independence: Independent Able to Take Stairs?: Yes Driving: Yes 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)  Communication Communication: No difficulties Dominant Hand: Right      Cognition  Overall Cognitive Status: Appears within functional limits for tasks assessed/performed Arousal/Alertness: Awake/alert Orientation Level: Oriented X4 / Intact Behavior During Session: [facility] for tasks performed    Extremity/Trunk Assessment Right Upper Extremity Assessment RUE ROM/Strength/Tone: Within functional levels Left Upper Extremity Assessment LUE ROM/Strength/Tone: Within functional levels Trunk Assessment Trunk Assessment: Normal   Mobility Bed Mobility Bed Mobility: Supine to Sit;Sitting - Scoot to Edge of Bed Supine to Sit: 4: Min assist;HOB flat Sitting - Scoot to Edge of Bed: 5: Supervision Sit to Supine: 4: Min assist Details for Bed Mobility Assistance: A with L LE only.   Transfers Transfers: Sit to Stand;Stand to Sit Sit to Stand: 4: Min guard;With upper extremity assist;From bed Stand to Sit: 4: Min guard;With upper extremity assist;To chair/3-in-1 Details for Transfer Assistance: vc for hand placement   Exercise    Balance Balance Balance Assessed: No  End of Session OT - End of Session Equipment Utilized During Treatment: Gait belt;Left lower extremity prosthesis Activity Tolerance: Patient tolerated treatment well Patient left: in chair;with call bell/phone within reach;with family/visitor present Nurse Communication: Mobility status CPM Left Knee CPM Left Knee: On Left Knee Flexion (Degrees): 60   GO     Orvetta Danielski,Steven Schneider 09/14/2011, 6:56 PM [facility], OTR/L  [phone] 09/14/2011

## 2011-09-14 NOTE — Progress Notes (Addendum)
ANTICOAGULATION CONSULT NOTE - Follow Up Consult  Pharmacy Consult for Coumadin Indication: VTE prophylaxis  No Known Allergies  Patient Measurements: Height: 5' 8.11" (173 cm) Weight: 262 lb 5.6 oz (119 kg) IBW/kg (Calculated) : 68.65    Vital Signs: Temp: 98.8 F (37.1 C) (07/20 1430) Temp src: Oral (07/20 1430) BP: 125/62 mmHg (07/20 1430) Pulse Rate: 110  (07/20 1430)  Labs:  Basename 09/14/11 0630 09/13/11 0850  HGB 11.9* --  HCT 35.5* --  PLT 178 --  APTT -- 30  LABPROT 15.0 12.6  INR 1.16 0.92  HEPARINUNFRC -- --  CREATININE 0.81 --  CKTOTAL -- --  CKMB -- --  TROPONINI -- --    Estimated Creatinine Clearance: 121.8 ml/min (by C-G formula based on Cr of 0.81).  Assessment: 60 YOM s/p Left TKA on 7/19, started on Coumadin last PM for VTE px. INR is subtherapeutic, as expected following first dose of Coumadin last PM. H/H and Plts have decr from baseline- likey ABLA d/t sx. Noted ongoing Celebrex- may potentiate INR.  Goal of Therapy:  INR 2-3 Monitor platelets by anticoagulation protocol: Yes   Plan:  - Repeat Coumadin 7.5mg  PO x 1 today - Will f/up daily INR  Keymoni Mccaster K. Allena Katz, PharmD, BCPS.  Clinical Pharmacist Pager [phone]. 09/14/2011 2:41 PM

## 2011-09-14 NOTE — Op Note (Signed)
NAMEMarland Schneider  [name], [name] NO.:  [id number]  MEDICAL RECORD NO.:  [id number]  LOCATION:  [id number]                        FACILITY:  [facility]  PHYSICIAN:  Almedia Balls. Ranell Patrick, M.D. DATE OF BIRTH:  Oct 24, 1950  DATE OF PROCEDURE:  09/13/2011 DATE OF DISCHARGE:                              OPERATIVE REPORT   PREOPERATIVE DIAGNOSIS:  Left knee end-stage osteoarthritis.  POSTOPERATIVE DIAGNOSIS:  Left knee end-stage osteoarthritis.  PROCEDURE PERFORMED:  Left total knee replacement using DePuy Sigma rotating platform prosthesis.  ATTENDING SURGEON:  Almedia Balls. Ranell Patrick, MD.  ASSISTANT:  Donnie Coffin. Dixon, PAC who was scrubbed in the entire procedure necessary for satisfactory completion of surgery.  ANESTHESIA:  General anesthesia plus femoral block was used.  ESTIMATED BLOOD LOSS:  Minimal.  FLUID REPLACEMENT:  1200 mL crystalloid.  INSTRUMENT COUNTS:  Correct.  There were no complications.  Perioperative antibiotics were given.  INDICATIONS:  The patient is a 61 year old male with worsening left knee pain.  The patient has had progressive pain despite conservative management.  The patient requires use of assistive device, cane or crutch to ambulate.  The patient is unable to walk more than 200 feet due to pain.  The patient has bone-on-bone on x-ray and MRI scan indicating advanced chondrolysis of medial compartment and patellofemoral compartment.  The patient has failed all measures of conservative management.  Desires operative treatment to restore function and limit pain to his knee.  Informed consent was obtained.  DESCRIPTION OF PROCEDURE:  After an adequate level of anesthesia was achieved, the patient was positioned in the supine position.  Left leg correctly identified.  Nonsterile tourniquet placed in the proximal thigh. Sterile prep and drape to the left leg performed.  Time-out called.  We then exsanguinated the leg using an Esmarch bandage, elevated and  flexed the knee.  We elevated the tourniquet to 350 mmHg. Longitudinal midline incision was created with the knee in flexion. Dissection was down through subcutaneous tissues using the knife.  We had used a fresh 10 blade for the medial parapatellar arthrotomy.  We divided the lateral patellofemoral ligaments and everted the patella, entered the distal femur using a step-cut drill and then introduced intramedullary guide set on 5 degrees left, 10 mm resection.  Once we had our distal femoral resection performed, we sized the femur 2.5 anterior down.  We then placed our 4 in 1 cutting block and resected anterior-posterior and chamfer cuts.  We removed ACL, PCL and meniscal tissue.  We then went ahead and cut the tibia just to allow 2 mm off the affected medial side, and then we went ahead and removed our posterior osteophytes off the femur and capsule released that. Checked our gaps. Flexion-extension gaps symmetric at 15 mm.  He just had a very loose knee.  We then went ahead and finished our tibial preparation with the modular drilling keel punch.  We then did our distal femoral resection with our distal femoral block for removing the box for the posterior cruciate substituting prosthesis.  We then impacted the 2.5 left femur in place.  We then reduced the knee with a size 15 spacer.  At this point, we resurfaced the patella  going from a size 24 down to 16.  We then drilled for a 38 patellar button.  We then placed the trial 38 button in place.  We ranged the knee fully, had excellent soft tissue balancing and stability, and normal patellar tracking with a no-touch technique.  We thoroughly irrigated the bone.  We then cemented the components into place using DePuy SmartSet cement.  Once the cement was allowed to harden, we used a 0.25-inch curved osteotome to remove excess cement.  Next, we trialed with the 15.  We felt like we had a little bit of hyperextension we went and placed a 17.5  insert and then found excellent stability.  So, we exchanged for the real 17.5 insert, ranged the knee fully, nice and stable, normal patellar tracking.  Thoroughly irrigated the knee and then closed in layers with the parapatellar arthrotomy with #1 Vicryl suture interrupted followed by 2-0 Vicryl and 0 Vicryl for later subcutaneous closure and 4-0 Monocryl for skin. Steri-Strips applied and sterile compressive bandage and knee immobilizer.  The patient tolerated surgery well.     Almedia Balls. Ranell Patrick, M.D.     SRN/MEDQ  D:  09/13/2011  T:  09/14/2011  Job:  [id number]

## 2011-09-14 NOTE — Evaluation (Signed)
Physical Therapy Evaluation Patient Details Name: Steven Schneider MRN: [id number] DOB: 01-12-1951 Today's Date: 09/14/2011 Time: 1109-1130 PT Time Calculation (min): 21 min  PT Assessment / Plan / Recommendation Clinical Impression  pt presents with L TKA.  pt moving well and very motivated.  pt will be able to progress to go home with HHPT.      PT Assessment  Patient needs continued PT services    Follow Up Recommendations  Home health PT;Supervision - Intermittent    Barriers to Discharge None      Equipment Recommendations  Rolling walker with 5" wheels;3 in 1 bedside comode    Recommendations for Other Services     Frequency 7X/week    Precautions / Restrictions Precautions Precautions: Fall Required Braces or Orthoses: Knee Immobilizer - Left Knee Immobilizer - Left: On when out of bed or walking (with ambulation) Restrictions Weight Bearing Restrictions: Yes LLE Weight Bearing: Weight bearing as tolerated   Pertinent Vitals/Pain 5/10      Mobility  Bed Mobility Bed Mobility: Supine to Sit;Sitting - Scoot to Edge of Bed Supine to Sit: 4: Min assist Sitting - Scoot to Edge of Bed: 4: Min assist Details for Bed Mobility Assistance: A with L LE only.   Transfers Transfers: Sit to Stand;Stand to Sit Sit to Stand: 4: Min assist;With upper extremity assist;From bed Stand to Sit: 4: Min guard;With upper extremity assist;With armrests;To chair/3-in-1 Details for Transfer Assistance: cues for use of UEs, positioning of LEs.   Ambulation/Gait Ambulation/Gait Assistance: 4: Min guard Ambulation Distance (Feet): 50 Feet Assistive device: Rolling walker Ambulation/Gait Assistance Details: cues for sequencing, upright posture, use of RW.   Gait Pattern: Step-to pattern;Decreased step length - right;Decreased stance time - left Stairs: No Wheelchair Mobility Wheelchair Mobility: No    Exercises Total Joint Exercises Ankle Circles/Pumps: AROM;Both;10 reps Quad Sets:  AROM;Both;10 reps Heel Slides: AAROM;Left;10 reps Long Arc Quad: AAROM;Left;10 reps Goniometric ROM: ~10-70   PT Diagnosis: Abnormality of gait;Acute pain  PT Problem List: Decreased strength;Decreased range of motion;Decreased activity tolerance;Decreased balance;Decreased mobility;Decreased knowledge of use of DME;Pain PT Treatment Interventions: DME instruction;Gait training;Stair training;Functional mobility training;Therapeutic activities;Therapeutic exercise;Balance training;Patient/family education   PT Goals Acute Rehab PT Goals PT Goal Formulation: With patient Time For Goal Achievement: 09/21/11 Potential to Achieve Goals: Good Pt will go Supine/Side to Sit: Independently PT Goal: Supine/Side to Sit - Progress: Goal set today Pt will go Sit to Supine/Side: Independently PT Goal: Sit to Supine/Side - Progress: Goal set today Pt will go Sit to Stand: with modified independence PT Goal: Sit to Stand - Progress: Goal set today Pt will go Stand to Sit: with modified independence PT Goal: Stand to Sit - Progress: Goal set today Pt will Ambulate: >150 feet;with modified independence;with rolling walker PT Goal: Ambulate - Progress: Goal set today Pt will Go Up / Down Stairs: 1-2 stairs;with min assist;with least restrictive assistive device PT Goal: Up/Down Stairs - Progress: Goal set today Pt will Perform Home Exercise Program: with supervision, verbal cues required/provided PT Goal: Perform Home Exercise Program - Progress: Goal set today  Visit Information  Last PT Received On: 09/14/11 Assistance Needed: +1    Subjective Data  Subjective: I guess I'm ready to do this.   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) Musician: No difficulties    Cognition  Overall Cognitive Status: Appears within functional limits for tasks assessed/performed Arousal/Alertness: Awake/alert Orientation Level: Oriented X4 / Intact Behavior During Session: WFL for tasks performed    Extremity/Trunk Assessment Right Lower Extremity Assessment RLE ROM/Strength/Tone: Within functional levels RLE Sensation: WFL - Light Touch Left Lower Extremity Assessment LLE ROM/Strength/Tone: Deficits LLE ROM/Strength/Tone Deficits: AAROM ~10-70 LLE Sensation: WFL - Light Touch Trunk Assessment Trunk Assessment: Normal   Balance Balance Balance Assessed: No  End of Session PT - End of Session Equipment Utilized During Treatment: Gait belt;Left knee immobilizer Activity Tolerance: Patient tolerated treatment well Patient left: in chair;with call bell/phone within reach Nurse Communication: Mobility status CPM Left Knee CPM Left Knee: Off  GP     Sunny Schlein, Eva [phone] 09/14/2011, 12:40 PM

## 2011-09-14 NOTE — Progress Notes (Signed)
Occupational Therapy Treatment Patient Details Name: LABARRON DURNIN MRN: [id number] DOB: 08/18/1950 Today's Date: 09/14/2011 Time: 4098-1191 OT Time Calculation (min): 17 min  OT Assessment / Plan / Recommendation Comments on Treatment Session Making excellent progress. will be able to D/C Monday. will focus on ADL and tub bnech transfers tomorrow. Answered many questions regarding rehab process. Ice aplied to knee.    Follow Up Recommendations  No OT follow up    Barriers to Discharge  None    Equipment Recommendations  Tub/shower bench    Recommendations for Other Services    Frequency Min 2X/week   Plan Discharge plan remains appropriate    Precautions / Restrictions Precautions Precautions: Fall Required Braces or Orthoses: Knee Immobilizer - Left Knee Immobilizer - Left: On when out of bed or walking Restrictions Weight Bearing Restrictions: Yes LLE Weight Bearing: Weight bearing as tolerated   Pertinent Vitals/Pain 4    ADL  Eating/Feeding: Performed;Independent Where Assessed - Eating/Feeding: Bed level Grooming: Performed;Supervision/safety Where Assessed - Grooming: Supported standing Upper Body Bathing: Simulated;Set up Where Assessed - Upper Body Bathing: Unsupported sitting Lower Body Bathing: Other (comment);Simulated (with use of long handled sponge) Where Assessed - Lower Body Bathing: Supported sitting Upper Body Dressing: Simulated;Set up Where Assessed - Upper Body Dressing: Unsupported sitting Lower Body Dressing: Performed;Other (comment) (educated on reacher and sock aid for LB dressing. mod vc) Where Assessed - Lower Body Dressing: Supported sit to Pharmacist, hospital: Performed;Minimal Dentist Method: Sit to Barista: Bedside commode Toileting - Clothing Manipulation and Hygiene: Performed;Minimal assistance Where Assessed - Engineer, mining and Hygiene: Standing Equipment Used:  Long-handled sponge;Knee Immobilizer;Gait belt;Rolling walker;Sock aid Transfers/Ambulation Related to ADLs: min guard ADL Comments: Interested in using AE to increase independence with self care.    OT Diagnosis: Generalized weakness;Acute pain  OT Problem List: Decreased strength;Decreased range of motion;Decreased knowledge of use of DME or AE;Decreased knowledge of precautions;Obesity;Pain OT Treatment Interventions: Self-care/ADL training;Energy conservation;DME and/or AE instruction;Therapeutic activities;Patient/family education   OT Goals Acute Rehab OT Goals OT Goal Formulation: With patient Time For Goal Achievement: 09/21/11 Potential to Achieve Goals: Good ADL Goals Pt Will Perform Lower Body Bathing: with supervision;with caregiver independent in assisting;Sit to stand from chair;Unsupported;with adaptive equipment ADL Goal: Lower Body Bathing - Progress: Progressing toward goals Pt Will Perform Lower Body Dressing: with supervision;with caregiver independent in assisting;Sit to stand from chair;Unsupported;with adaptive equipment ADL Goal: Lower Body Dressing - Progress: Progressing toward goals Pt Will Transfer to Toilet: with supervision;with caregiver independent in assisting;with DME;3-in-1 ADL Goal: Toilet Transfer - Progress: Progressing toward goals Pt Will Perform Toileting - Clothing Manipulation: with supervision;with caregiver independent in assisting;Standing ADL Goal: Toileting - Clothing Manipulation - Progress: Progressing toward goals Pt Will Perform Toileting - Hygiene: with modified independence;Sit to stand from 3-in-1/toilet ADL Goal: Toileting - Hygiene - Progress: Progressing toward goals Pt Will Perform Tub/Shower Transfer: with supervision;with caregiver independent in assisting;Ambulation;with DME;Transfer tub bench ADL Goal: Tub/Shower Transfer - Progress: Progressing toward goals  Visit Information  Last OT Received On: 09/14/11 Assistance Needed:  +1    Subjective Data      Prior Functioning  Home Living Lives With: Spouse Available Help at Discharge: Family;Available 24 hours/day Type of Home: House Home Access: Stairs to enter [employer] of Steps: 2 Entrance Stairs-Rails: None Home Layout: One level Bathroom Shower/Tub: Forensic scientist: Standard Bathroom Accessibility: Yes How Accessible: Accessible via walker Home Adaptive Equipment: Straight cane Prior Function Level of Independence:  Independent Able to Take Stairs?: Yes Driving: Yes 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) Communication Communication: No difficulties Dominant Hand: Right    Cognition  Overall Cognitive Status: Appears within functional limits for tasks assessed/performed Arousal/Alertness: Awake/alert Orientation Level: Oriented X4 / Intact Behavior During Session: [facility] for tasks performed    Mobility Bed Mobility Bed Mobility: Supine to Sit;Sitting - Scoot to Edge of Bed Supine to Sit: 4: Min assist;HOB flat Sitting - Scoot to [place] of Bed: 5: Supervision Transfers Transfers: Sit to Stand;Stand to Sit Sit to Stand: 4: Min guard;With upper extremity assist;From bed Stand to Sit: 4: Min guard;With upper extremity assist;To chair/3-in-1 Details for Transfer Assistance: vc for hand placement   Exercises    Balance    End of Session OT - End of Session Equipment Utilized During Treatment: Gait belt;Left knee immobilizer Activity Tolerance: Patient tolerated treatment well Patient left: in chair;with call bell/phone within reach;with family/visitor present Nurse Communication: Mobility status CPM Left Knee CPM Left Knee: On Left Knee Flexion (Degrees): 60   GO     Marua Qin,HILLARY 09/14/2011, 7:02 PM [facility], OTR/L  [phone] 09/14/2011

## 2011-09-15 LAB — CBC
HCT: 33.5 % — ABNORMAL LOW (ref 39.0–52.0)
MCH: 30.7 pg (ref 26.0–34.0)
MCV: 93.6 fL (ref 78.0–100.0)
RBC: 3.58 MIL/uL — ABNORMAL LOW (ref 4.22–5.81)
RDW: 13.6 % (ref 11.5–15.5)
WBC: 10 10*3/uL (ref 4.0–10.5)

## 2011-09-15 MED ORDER — DOCUSATE SODIUM 100 MG PO CAPS
100.0000 mg | ORAL_CAPSULE | Freq: Two times a day (BID) | ORAL | Status: DC | PRN
  Administered 2011-09-15 (×2): 100 mg via ORAL
  Filled 2011-09-15 (×2): qty 1

## 2011-09-15 MED ORDER — WARFARIN SODIUM 7.5 MG PO TABS
7.5000 mg | ORAL_TABLET | Freq: Once | ORAL | Status: DC
  Filled 2011-09-15: qty 1

## 2011-09-15 NOTE — Progress Notes (Signed)
ANTICOAGULATION CONSULT NOTE - Follow Up Consult  Pharmacy Consult for Coumadin Indication: VTE prophylaxis  No Known Allergies  Patient Measurements: Height: 5' 8.11" (173 cm) Weight: 262 lb 5.6 oz (119 kg) IBW/kg (Calculated) : 68.65    Vital Signs: Temp: 98.2 F (36.8 C) (07/21 0612) Temp src: Oral (07/21 0612) BP: 119/58 mmHg (07/21 0612) Pulse Rate: 101  (07/21 0612)  Labs:  Basename 09/15/11 0600 09/14/11 0630 09/13/11 0850  HGB 11.0* 11.9* --  HCT 33.5* 35.5* --  PLT 180 178 --  APTT -- -- 30  LABPROT 17.7* 15.0 12.6  INR 1.43 1.16 0.92  HEPARINUNFRC -- -- --  CREATININE -- 0.81 --  CKTOTAL -- -- --  CKMB -- -- --  TROPONINI -- -- --    Estimated Creatinine Clearance: 121.8 ml/min (by C-G formula based on Cr of 0.81).  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 INR sub therapeutic at 1.43 but increasing. H/H and plts decreasing- likely due to acute blood loss anemia post-op. Noted ongoing Celebrex which may potentiate the effect of Coumadin.   Goal of Therapy:  INR 2-3 Monitor platelets by anticoagulation protocol: Yes   Plan:  - Coumadin 7.5mg  PO x 1 today - INR in AM   [facility], Pharm.D. Clinical Pharmacist   Pager: [phone] Phone: [phone] 09/15/2011 8:47 AM    Soni Kegel D. Laney Potash, PharmD, BCPS Pager:  [phone] 09/15/2011, 9:41 AM

## 2011-09-15 NOTE — Progress Notes (Signed)
Subjective: Doing well, no BM   Objective: Vital signs in last 24 hours: Temp:  [98.2 F (36.8 C)-99.4 F (37.4 C)] 98.2 F (36.8 C) (07/21 0612) Pulse Rate:  [101-115] 101  (07/21 0612) Resp:  [18-20] 18  (07/21 0612) BP: (119-138)/(57-62) 119/58 mmHg (07/21 0612) SpO2:  [96 %-97 %] 96 % (07/21 0612)  Intake/Output from previous day:   Intake/Output this shift:     Basename 09/15/11 0600 09/14/11 0630  HGB 11.0* 11.9*    Basename 09/15/11 0600 09/14/11 0630  WBC 10.0 9.8  RBC 3.58* 3.77*  HCT 33.5* 35.5*  PLT 180 178    Basename 09/14/11 0630  NA 137  K 3.9  CL 100  CO2 28  BUN 10  CREATININE 0.81  GLUCOSE 161*  CALCIUM 8.7    Basename 09/15/11 0600 09/14/11 0630  LABPT -- --  INR 1.43 1.16    Cons alert no distress, left leg wound well approx with steristrips, no drainage, no blisters. Calf soft nontender, foot NMVI  Assessment/Plan: POD #2 S/P left TKA doing well, will add colace. OOB with PT , poss home Monday.   Steven Schneider 09/15/2011, 10:51 AM

## 2011-09-15 NOTE — Progress Notes (Signed)
CSW consult for SNF.  PT recommendation for HHPT with intermittent supervision and OT with no f/u recs noted.  CSW signing off as no other  CSW needs identified. Please re-consult if CSW needs arise.  Dellie Burns, MSW, [place] [phone] (weekend)

## 2011-09-15 NOTE — Progress Notes (Signed)
Physical Therapy Treatment Patient Details Name: Steven Schneider MRN: [id number] DOB: [birth year] Today's Date: 09/15/2011 Time: 2130-8657 PT Time Calculation (min): 24 min  PT Assessment / Plan / Recommendation Comments on Treatment Session  Pt progressing well, completed stairs this session. Pt and wife educated on proper sequencing and safety. Plan to d/c home tomorrow    Follow Up Recommendations  Home health PT;Supervision - Intermittent    Barriers to Discharge        Equipment Recommendations  Tub/shower bench    Recommendations for Other Services    Frequency 7X/week   Plan Discharge plan remains appropriate;Frequency remains appropriate    Precautions / Restrictions Precautions Precautions: Fall Required Braces or Orthoses: Knee Immobilizer - Left Knee Immobilizer - Left: On when out of bed or walking Restrictions Weight Bearing Restrictions: Yes LLE Weight Bearing: Weight bearing as tolerated       Mobility  Bed Mobility Bed Mobility: Supine to Sit;Sitting - Scoot to Edge of Bed Supine to Sit: 4: Min assist;HOB flat Sitting - Scoot to Edge of Bed: 5: Supervision Details for Bed Mobility Assistance: Min assist with LLE into sitting. Pt able to complete with supervision only with KI on.  Transfers Transfers: Sit to Stand;Stand to Sit Sit to Stand: 4: Min guard;With upper extremity assist;From bed Stand to Sit: 4: Min guard;With upper extremity assist;To chair/3-in-1 Details for Transfer Assistance: VC for proper hand placement and sequencing. Minguard for safety Ambulation/Gait Ambulation/Gait Assistance: 4: Min guard Ambulation Distance (Feet): 150 Feet Assistive device: Rolling walker Ambulation/Gait Assistance Details: VC for proper sequencing and positioning. Pt required frequent rest breaks due to UE fatigue, encouraged increased use of LEs.  Gait Pattern: Step-to pattern;Decreased step length - right;Decreased stance time - left Stairs: Yes Stairs  Assistance: 4: Min assist Stairs Assistance Details (indicate cue type and reason): Min assist for safety with RW. VC for proper sequencing with RW on stairs, wife present. Pt provided with handout. Stair Management Technique: No rails;Backwards;Step to pattern;With walker Number of Stairs: 3     Exercises Total Joint Exercises Heel Slides: AAROM;Left;10 reps;Seated Long Arc Quad: AAROM;Left;10 reps Goniometric ROM: 10-85   PT Goals Acute Rehab PT Goals PT Goal: Supine/Side to Sit - Progress: Progressing toward goal PT Goal: Sit to Supine/Side - Progress: Progressing toward goal PT Goal: Sit to Stand - Progress: Progressing toward goal PT Goal: Stand to Sit - Progress: Progressing toward goal PT Goal: Ambulate - Progress: Progressing toward goal PT Goal: Up/Down Stairs - Progress: Met PT Goal: Perform Home Exercise Program - Progress: Progressing toward goal  Visit Information  Last PT Received On: 09/15/11 Assistance Needed: +1    Subjective Data      Cognition  Overall Cognitive Status: Appears within functional limits for tasks assessed/performed Arousal/Alertness: Awake/alert Orientation Level: Oriented X4 / Intact Behavior During Session: [facility] for tasks performed    Balance     End of Session PT - End of Session Equipment Utilized During Treatment: Gait belt;Left knee immobilizer Activity Tolerance: Patient tolerated treatment well Patient left: in bed;with call bell/phone within reach;with family/visitor present Nurse Communication: Mobility status     Milana Kidney 09/15/2011, 2:59 PM  09/15/2011 Milana Kidney DPT PAGER: [phone] OFFICE: [phone]

## 2011-09-15 NOTE — Progress Notes (Signed)
Occupational Therapy Treatment Patient Details Name: Steven Schneider MRN: [id number] DOB: August 07, 1950 Today's Date: 09/15/2011 Time: 4098-1191 OT Time Calculation (min): 24 min  OT Assessment / Plan / Recommendation Comments on Treatment Session Excellent progress. Family education ongoing. Ready for D/C Monday.    Follow Up Recommendations  No OT follow up    Barriers to Discharge       Equipment Recommendations  Tub/shower bench    Recommendations for Other Services    Frequency Min 2X/week   Plan Discharge plan remains appropriate    Precautions / Restrictions Precautions Precautions: Fall Required Braces or Orthoses: Knee Immobilizer - Left Knee Immobilizer - Left: On when out of bed or walking Restrictions Weight Bearing Restrictions: Yes LLE Weight Bearing: Weight bearing as tolerated   Pertinent Vitals/Pain 3    ADL  Tub/Shower Transfer: Performed;Minimal assistance Tub/Shower Transfer Method: Science writer: Counsellor Used: Rolling walker;Knee Immobilizer;Gait belt Transfers/Ambulation Related to ADLs: S ADL Comments: Reviewed AE for ADL. Pt to notify therapy if he desires a tub bench    OT Diagnosis:    OT Problem List:   OT Treatment Interventions:     OT Goals Acute Rehab OT Goals OT Goal Formulation: With patient Time For Goal Achievement: 09/21/11 Potential to Achieve Goals: Good ADL Goals Pt Will Perform Lower Body Bathing: with supervision;with caregiver independent in assisting;Sit to stand from chair;Unsupported;with adaptive equipment ADL Goal: Lower Body Bathing - Progress: Progressing toward goals Pt Will Perform Lower Body Dressing: with supervision;with caregiver independent in assisting;Sit to stand from chair;Unsupported;with adaptive equipment ADL Goal: Lower Body Dressing - Progress: Progressing toward goals Pt Will Transfer to Toilet: with supervision;with caregiver independent in  assisting;with DME;3-in-1 ADL Goal: Toilet Transfer - Progress: Met Pt Will Perform Toileting - Clothing Manipulation: with supervision;with caregiver independent in assisting;Standing ADL Goal: Toileting - Clothing Manipulation - Progress: Met Pt Will Perform Toileting - Hygiene: with modified independence;Sit to stand from 3-in-1/toilet ADL Goal: Toileting - Hygiene - Progress: Met Pt Will Perform Tub/Shower Transfer: with supervision;with caregiver independent in assisting;Ambulation;with DME;Transfer tub bench ADL Goal: Tub/Shower Transfer - Progress: Progressing toward goals  Visit Information  Last OT Received On: 09/15/11 Assistance Needed: +1    Subjective Data      Prior Functioning       Cognition  Overall Cognitive Status: Appears within functional limits for tasks assessed/performed Arousal/Alertness: Awake/alert Orientation Level: Oriented X4 / Intact Behavior During Session: [facility] for tasks performed    Mobility Bed Mobility Bed Mobility: Supine to Sit;Sitting - Scoot to Edge of Bed Supine to Sit: 4: Min assist;HOB flat Sitting - Scoot to Edge of Bed: 5: Supervision Details for Bed Mobility Assistance: Min assist with LLE into sitting. Pt able to complete with supervision only with KI on.  Transfers Transfers: Sit to Stand;Stand to Sit Sit to Stand: 5: Supervision;With upper extremity assist;From chair/3-in-1 Stand to Sit: 5: Supervision;To chair/3-in-1;With upper extremity assist Details for Transfer Assistance: good technique   Exercises Total Joint Exercises Heel Slides: AAROM;Left;10 reps;Seated Long Arc Quad: AAROM;Left;10 reps Goniometric ROM: 10-85  Balance  WFL  End of Session OT - End of Session Equipment Utilized During Treatment: Gait belt;Left knee immobilizer Activity Tolerance: Patient tolerated treatment well Patient left: in chair;with call bell/phone within reach;with family/visitor present Nurse Communication: Mobility status  GO      Steven Schneider,Steven Schneider 09/15/2011, 3:35 PM [facility], OTR/L  [phone] 09/15/2011

## 2011-09-15 NOTE — Progress Notes (Signed)
CSW consult for SNF.  PT recommendation for HHPT with intermittent supervision and OT with no f/u recommendations noted.  Per chart review, pt with 24 hour supervision in place at d/c.  CSW signing off as no other CSW needs identified.  Please re-consult if SNF needed.  Dellie Burns, MSW, [place] [phone] (weekend)

## 2011-09-16 ENCOUNTER — Encounter (HOSPITAL_COMMUNITY): Payer: Self-pay | Admitting: Orthopedic Surgery

## 2011-09-16 LAB — CBC
HCT: 29.7 % — ABNORMAL LOW (ref 39.0–52.0)
MCH: 31.5 pg (ref 26.0–34.0)
MCV: 93.7 fL (ref 78.0–100.0)
RBC: 3.17 MIL/uL — ABNORMAL LOW (ref 4.22–5.81)
WBC: 5.7 10*3/uL (ref 4.0–10.5)

## 2011-09-16 MED ORDER — WARFARIN SODIUM 7.5 MG PO TABS
7.5000 mg | ORAL_TABLET | Freq: Once | ORAL | Status: DC
  Filled 2011-09-16: qty 1

## 2011-09-16 MED ORDER — OXYCODONE HCL 5 MG PO TABS: 5.0000 mg | ORAL_TABLET | ORAL | Status: AC | PRN

## 2011-09-16 MED ORDER — METHOCARBAMOL 500 MG PO TABS: 500.0000 mg | ORAL_TABLET | Freq: Four times a day (QID) | ORAL | Status: AC | PRN

## 2011-09-16 NOTE — Discharge Summary (Signed)
Physician Discharge Summary  Patient ID: Steven Schneider MRN: [id number] DOB/AGE: 05/04/1950 61 y.o.  Admit date: 09/13/2011 Discharge date: 09/16/2011  Admission Diagnoses:  Left knee end stage osteoarthritis  Discharge Diagnoses:  Same   Surgeries: Procedure(s): TOTAL KNEE ARTHROPLASTY on 09/13/2011   Consultants: PT/OT  Discharged Condition: Stable  Hospital Course: Steven Schneider is an 61 y.o. male who was admitted 09/13/2011 with a chief complaint of No chief complaint on file. , and found to have a diagnosis of <principal problem not specified>.  They were brought to the operating room on 09/13/2011 and underwent the above named procedures.    The patient had an uncomplicated hospital course and was stable for discharge.  Recent vital signs:  Filed Vitals:   09/16/11 0800  BP:   Pulse:   Temp:   Resp: 17    Recent laboratory studies:  Results for orders placed during the hospital encounter of 09/13/11  GLUCOSE, CAPILLARY      Component Value Range   Glucose-Capillary 141 (*) 70 - 99 mg/dL  PROTIME-INR      Component Value Range   Prothrombin Time 12.6  11.6 - 15.2 seconds   INR 0.92  0.00 - 1.49  APTT      Component Value Range   aPTT 30  24 - 37 seconds  GLUCOSE, CAPILLARY      Component Value Range   Glucose-Capillary 160 (*) 70 - 99 mg/dL  GLUCOSE, CAPILLARY      Component Value Range   Glucose-Capillary 135 (*) 70 - 99 mg/dL  PROTIME-INR      Component Value Range   Prothrombin Time 15.0  11.6 - 15.2 seconds   INR 1.16  0.00 - 1.49  CBC      Component Value Range   WBC 9.8  4.0 - 10.5 K/uL   RBC 3.77 (*) 4.22 - 5.81 MIL/uL   Hemoglobin 11.9 (*) 13.0 - 17.0 g/dL   HCT 40.9 (*) 81.1 - 91.4 %   MCV 94.2  78.0 - 100.0 fL   MCH 31.6  26.0 - 34.0 pg   MCHC 33.5  30.0 - 36.0 g/dL   RDW 78.2  95.6 - 21.3 %   Platelets 178  150 - 400 K/uL  BASIC METABOLIC PANEL      Component Value Range   Sodium 137  135 - 145 mEq/L   Potassium 3.9  3.5 - 5.1 mEq/L     Chloride 100  96 - 112 mEq/L   CO2 28  19 - 32 mEq/L   Glucose, Bld 161 (*) 70 - 99 mg/dL   BUN 10  6 - 23 mg/dL   Creatinine, Ser 0.86  0.50 - 1.35 mg/dL   Calcium 8.7  8.4 - 57.8 mg/dL   GFR calc non Af Amer >90  >90 mL/min   GFR calc Af Amer >90  >90 mL/min  GLUCOSE, CAPILLARY      Component Value Range   Glucose-Capillary 137 (*) 70 - 99 mg/dL  GLUCOSE, CAPILLARY      Component Value Range   Glucose-Capillary 150 (*) 70 - 99 mg/dL  PROTIME-INR      Component Value Range   Prothrombin Time 17.7 (*) 11.6 - 15.2 seconds   INR 1.43  0.00 - 1.49  CBC      Component Value Range   WBC 10.0  4.0 - 10.5 K/uL   RBC 3.58 (*) 4.22 - 5.81 MIL/uL   Hemoglobin 11.0 (*) 13.0 - 17.0  g/dL   HCT 40.9 (*) 81.1 - 91.4 %   MCV 93.6  78.0 - 100.0 fL   MCH 30.7  26.0 - 34.0 pg   MCHC 32.8  30.0 - 36.0 g/dL   RDW 78.2  95.6 - 21.3 %   Platelets 180  150 - 400 K/uL  PROTIME-INR      Component Value Range   Prothrombin Time 17.6 (*) 11.6 - 15.2 seconds   INR 1.42  0.00 - 1.49  CBC      Component Value Range   WBC 5.7  4.0 - 10.5 K/uL   RBC 3.17 (*) 4.22 - 5.81 MIL/uL   Hemoglobin 10.0 (*) 13.0 - 17.0 g/dL   HCT 08.6 (*) 57.8 - 46.9 %   MCV 93.7  78.0 - 100.0 fL   MCH 31.5  26.0 - 34.0 pg   MCHC 33.7  30.0 - 36.0 g/dL   RDW 62.9  52.8 - 41.3 %   Platelets 176  150 - 400 K/uL  GLUCOSE, CAPILLARY      Component Value Range   Glucose-Capillary 130 (*) 70 - 99 mg/dL    Discharge Medications:   Medication List  As of 09/16/2011 10:38 AM   TAKE these medications         acetaminophen 500 MG tablet   Commonly known as: TYLENOL   Take 1,000 mg by mouth every 6 (six) hours as needed. For pain      albuterol 108 (90 BASE) MCG/ACT inhaler   Commonly known as: PROVENTIL HFA;VENTOLIN HFA   Inhale 2 puffs into the lungs every 6 (six) hours as needed.      HYDROcodone-acetaminophen 5-500 MG per tablet   Commonly known as: VICODIN   Take 1 tablet by mouth every 6 (six) hours as needed. For  pain      methocarbamol 500 MG tablet   Commonly known as: ROBAXIN   Take 1 tablet (500 mg total) by mouth every 6 (six) hours as needed.      oxyCODONE 5 MG immediate release tablet   Commonly known as: Oxy IR/ROXICODONE   Take 1-2 tablets (5-10 mg total) by mouth every 3 (three) hours as needed.      SYSTANE OP   Apply 2 drops to eye 3 (three) times daily as needed. For dry eyes      testosterone 50 MG/5GM Gel   Commonly known as: ANDROGEL   Place 5 g onto the skin daily.            Diagnostic Studies: Dg Chest 2 View  09/13/2011  *RADIOLOGY REPORT*  Clinical Data: Preoperative respiratory exam for knee arthroplasty  CHEST - 2 VIEW  Comparison: None.  Findings: Heart size is normal.  Mediastinal shadows are normal. Lungs are clear.  No effusions.  No bony abnormalities.  IMPRESSION: Normal chest  Original Report Authenticated By: Thomasenia Sales, M.D.   X-ray Knee Left Port  09/13/2011  *RADIOLOGY REPORT*  Clinical Data: 61 year old male status post left knee arthroplasty.  PORTABLE LEFT KNEE - 1-2 VIEW  Comparison: None.  Findings: AP portable supine cross-table lateral views of the left knee at 1336 hours.  Sequelae of total knee arthroplasty.  Hardware components appear intact and normally aligned.  Postoperative changes to the surrounding soft tissues.  No unexpected osseous changes.  IMPRESSION: Left knee arthroplasty with no adverse features.  Original Report Authenticated By: Harley Hallmark, M.D.    Disposition: Final discharge disposition not confirmed  Discharge Orders  Future Appointments: Provider: Department: Dept Phone: Center:   09/20/2011 8:00 AM Carrolyn Leigh, RD [facility] [phone] [facility]     Future Orders Please Complete By Expires   Diet - low sodium heart healthy      Call MD / Call 911      Comments:   If you experience chest pain or shortness of breath, CALL 911 and be transported to the hospital emergency room.  If you develope a fever  above 101 F, pus (white drainage) or increased drainage or redness at the wound, or calf pain, call your surgeon's office.   Constipation Prevention      Comments:   Drink plenty of fluids.  Prune juice may be helpful.  You may use a stool softener, such as Colace (over the counter) 100 mg twice a day.  Use MiraLax (over the counter) for constipation as needed.   Increase activity slowly as tolerated            Signed: Yanky Vanderburg B 09/16/2011, 10:38 AM

## 2011-09-16 NOTE — Progress Notes (Signed)
Physical Therapy Treatment Patient Details Name: Steven Schneider MRN: [id number] DOB: June 24, 1950 Today's Date: 09/16/2011 Time: 6962-9528 PT Time Calculation (min): 12 min  PT Assessment / Plan / Recommendation Comments on Treatment Session  Pt ready for discharge Reviewed HEP with patient and spouse.    Follow Up Recommendations  Home health PT             Frequency 7X/week   Plan Discharge plan remains appropriate;Frequency remains appropriate    Precautions / Restrictions Precautions Precautions: Fall Restrictions Weight Bearing Restrictions: Yes LLE Weight Bearing: Weight bearing as tolerated   Pertinent Vitals/Pain 3/10   Mobility  Transfers Transfers: Sit to Stand;Stand to Sit Sit to Stand: 6: Modified independent (Device/Increase time) Stand to Sit: 6: Modified independent (Device/Increase time) Details for Transfer Assistance: Pt continues to demonstrates proper hand placement and technique    Exercises Total Joint Exercises Ankle Circles/Pumps: AROM;Both;10 reps Heel Slides: AAROM;Left;10 reps;Seated Long Arc Quad: AAROM;Left;10 reps Marching in Standing: AROM;Left;10 reps Standing Hip Extension: AROM;Left;10 reps     PT Goals Acute Rehab PT Goals Pt will Perform Home Exercise Program: with supervision, verbal cues required/provided PT Goal: Perform Home Exercise Program - Progress: Met  Visit Information  Last PT Received On: 09/16/11 Assistance Needed: +1    Subjective Data  Subjective: "I feel such a difference even from this morning" Patient Stated Goal: to go home   Cognition  Overall Cognitive Status: Appears within functional limits for tasks assessed/performed Arousal/Alertness: Awake/alert Orientation Level: Oriented X4 / Intact Behavior During Session: [facility] for tasks performed    Balance     End of Session PT - End of Session Activity Tolerance: Patient tolerated treatment well Patient left: in chair;with family/visitor present Nurse  Communication: Mobility status   GP     Fabio Asa 09/16/2011, 2:37 PM

## 2011-09-16 NOTE — Progress Notes (Signed)
ANTICOAGULATION CONSULT NOTE - Follow Up Consult  Pharmacy Consult for Coumadin Indication: VTE prophylaxis  No Known Allergies  Patient Measurements: Height: 5' 8.11" (173 cm) Weight: 262 lb 5.6 oz (119 kg) IBW/kg (Calculated) : 68.65    Vital Signs:    Labs:  Basename 09/16/11 0554 09/15/11 0600 09/14/11 0630  HGB 10.0* 11.0* --  HCT 29.7* 33.5* 35.5*  PLT 176 180 178  APTT -- -- --  LABPROT 17.6* 17.7* 15.0  INR 1.42 1.43 1.16  HEPARINUNFRC -- -- --  CREATININE -- -- 0.81  CKTOTAL -- -- --  CKMB -- -- --  TROPONINI -- -- --    Estimated Creatinine Clearance: 121.8 ml/min (by C-G formula based on Cr of 0.81).  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 INR remains sub therapeutic at 1.42.  Due to downtime, warfarin administration not documented. Paper chart does not have documentation either.    H/H decreasing, likely due to acute blood loss anemia post-op. Noted ongoing Celebrex, which may potentiate the effect of Coumadin.  Goal of Therapy:  INR 2-3 Monitor platelets by anticoagulation protocol: Yes   Plan:  Pt to be d/c'd today. No orders in for warfarin upon discharge. Spoke with nurse, he reported the pt received paper copies of the prescription to continue warfarin after d/c. If pt is still here tonight, Coumadin 7.5 mg x1 today, and continue daily INR.    Doris Cheadle, PharmD Clinical Pharmacist Pager: [phone] Phone: [phone] 09/16/2011 11:16 AM

## 2011-09-16 NOTE — Progress Notes (Signed)
Orthopedics Progress Note  Subjective: Pt doing well s/p left total knee Minimal pain  Ready for d/c home  Objective:  Filed Vitals:   09/16/11 0800  BP:   Pulse:   Temp:   Resp: 17    General: Awake and alert  Musculoskeletal: left knee incision healing well, nv intact distally, no erythema or drainage Neurovascularly intact  Lab Results  Component Value Date   WBC 5.7 09/16/2011   HGB 10.0* 09/16/2011   HCT 29.7* 09/16/2011   MCV 93.7 09/16/2011   PLT 176 09/16/2011       Component Value Date/Time   NA 137 09/14/2011 0630   K 3.9 09/14/2011 0630   CL 100 09/14/2011 0630   CO2 28 09/14/2011 0630   GLUCOSE 161* 09/14/2011 0630   BUN 10 09/14/2011 0630   CREATININE 0.81 09/14/2011 0630   CALCIUM 8.7 09/14/2011 0630   GFRNONAA >90 09/14/2011 0630   GFRAA >90 09/14/2011 0630    Lab Results  Component Value Date   INR 1.42 09/16/2011   INR 1.43 09/15/2011   INR 1.16 09/14/2011    Assessment/Plan: POD #3 s/p Procedure(s):left TOTAL KNEE ARTHROPLASTY  D/c home today F/u in 2 weeks  Almedia Balls. Ranell Patrick, MD 09/16/2011 10:35 AM

## 2011-09-16 NOTE — Progress Notes (Signed)
Physical Therapy Treatment Patient Details Name: DERREN SUYDAM MRN: [id number] DOB: 10/28/1950 Today's Date: 09/16/2011 Time: 6295-2841 PT Time Calculation (min): 31 min  PT Assessment / Plan / Recommendation Comments on Treatment Session  Pt is making steady progress towards PT gaols. Pt demonstrates safe and proper techniques for transfers, ambulation, and stairs.      Follow Up Recommendations  Home health PT;Supervision - Intermittent             Frequency 7X/week   Plan Discharge plan remains appropriate;Frequency remains appropriate    Precautions / Restrictions Precautions Precautions: Fall Restrictions Weight Bearing Restrictions: Yes LLE Weight Bearing: Weight bearing as tolerated   Pertinent Vitals/Pain 4/10    Mobility  Transfers Transfers: Sit to Stand;Stand to Sit Sit to Stand: 6: Modified independent (Device/Increase time) Stand to Sit: 6: Modified independent (Device/Increase time) Details for Transfer Assistance: Pt demonstrates proper handplacement and technique Ambulation/Gait Ambulation/Gait Assistance: 5: Supervision Ambulation Distance (Feet): 300 Feet Assistive device: Rolling walker Ambulation/Gait Assistance Details: VC to look up and increase cadence Gait Pattern: Step-through pattern Stairs: Yes Stairs Assistance: 4: Min assist Stairs Assistance Details (indicate cue type and reason): Min assist for safety with RW. VC for proper sequencing with RW on stairs, wife present. Pt provided with handout. Stair Management Technique: No rails;Backwards;Step to pattern;With walker Number of Stairs: 3     Exercises Total Joint Exercises Ankle Circles/Pumps: AROM;Both;10 reps Goniometric ROM: 5-85   PT Diagnosis:  L TKA    PT Goals Acute Rehab PT Goals Pt will go Sit to Stand: with modified independence PT Goal: Sit to Stand - Progress: Met Pt will go Stand to Sit: with modified independence PT Goal: Stand to Sit - Progress: Met Pt will  Ambulate: >150 feet;with modified independence;with rolling walker PT Goal: Ambulate - Progress: Progressing toward goal Pt will Go Up / Down Stairs: 1-2 stairs;with min assist;with least restrictive assistive device PT Goal: Up/Down Stairs - Progress: Met Pt will Perform Home Exercise Program: with supervision, verbal cues required/provided PT Goal: Perform Home Exercise Program - Progress: Progressing toward goal  Visit Information  Last PT Received On: 09/16/11 Assistance Needed: +1    Subjective Data  Subjective: "I have been up and moving around pretty well" Patient Stated Goal: to go home   Cognition  Overall Cognitive Status: Appears within functional limits for tasks assessed/performed Arousal/Alertness: Awake/alert Orientation Level: Oriented X4 / Intact Behavior During Session: [facility] for tasks performed       End of Session PT - End of Session Equipment Utilized During Treatment: Gait belt Activity Tolerance: Patient tolerated treatment well Patient left: in chair;with call bell/phone within reach;with nursing in room;with family/visitor present Nurse Communication: Mobility status;Weight bearing status   GP     Fabio Asa 09/16/2011, 10:23 AM

## 2011-09-19 MED FILL — Oxycodone HCl Tab 5 MG: ORAL | Qty: 2 | Status: AC

## 2011-09-19 MED FILL — Methocarbamol Tab 500 MG: ORAL | Qty: 1 | Status: AC

## 2011-09-20 ENCOUNTER — Ambulatory Visit: Payer: BC Managed Care – PPO | Admitting: *Deleted

## 2011-11-15 ENCOUNTER — Ambulatory Visit: Payer: BC Managed Care – PPO | Admitting: *Deleted

## 2011-11-15 ENCOUNTER — Encounter: Payer: BC Managed Care – PPO | Attending: Nurse Practitioner | Admitting: *Deleted

## 2011-11-15 VITALS — Wt 253.5 lb

## 2011-11-15 DIAGNOSIS — E119 Type 2 diabetes mellitus without complications: Secondary | ICD-10-CM

## 2011-11-15 DIAGNOSIS — Z713 Dietary counseling and surveillance: Secondary | ICD-10-CM | POA: Insufficient documentation

## 2011-11-15 NOTE — Patient Instructions (Addendum)
Check glucose 2 times a day: 1st thing in morning goal is <120 mg/dl And 2 hours after start of meal goal is <160 mg/dl  Continue to count carbs and read food labels  Add in physical activity: walking 30 minutes 3-5 days/week  Consider taking statin for cholesterol  Limit animal fat and butters, creams, etc.    Choose sugar free creamer for coffee

## 2011-11-15 NOTE — Progress Notes (Signed)
  Medical Nutrition Therapy:  Appt start time: 1030 end time:  1100.   Assessment:  Primary concerns today: diabetes follow up.   MEDICATIONS: none currently   DIETARY INTAKE:  Usual eating pattern includes 3 meals and 1-3 snacks per day.  Everyday foods include proteins, starches, fruits, and vegetables.  Avoided foods include sugary beverages and concentrated sweets.    Usual physical activity: physical therapy and walking 2 times a week  Estimated energy needs: 1800 calories 200 g carbohydrates 135 g protein 50 g fat  Progress Towards Goal(s):  Some progress.  Steven Schneider has been counting his carbohydrates and reading food labels and trying to follow dietary recommendations.  His recent A1C dropped from 6.7% to 6%!!  He has been taken off metoformin due to good glucose control.   Nutritional Diagnosis:  NB-1.6 Previous limited adherence to nutrition-related recommendations related to skipping meals and refined carbohydrates consumption. As evidenced by obesity and diagnosis of diabetse     Intervention:  Nutrition counseling provided.  Applauded Steven Schneider for his progress.  He has knee surgery over the past 3 months and was not able to be physically active until recently.  He has just finished PT and has started walking 2 days a week- his goal is to walk 5 days a week.  He has lost 7 pounds since June and his blood pressure has dropped.  He had some questions about going on lipitor and I encouraged him to follow doctor's recommendations.  If her continues to make lifestyle modifications (exercise and limit saturated fats) he may not need as much medication in the future.  Encouraged regular glucose monitoring and to continue the progress he's made.     Monitoring/Evaluation:  Dietary intake, exercise, GBM, and body weight in 1 month(s).

## 2012-01-15 ENCOUNTER — Ambulatory Visit: Payer: BC Managed Care – PPO | Admitting: *Deleted

## 2012-02-17 ENCOUNTER — Encounter: Payer: BC Managed Care – PPO | Attending: Family Medicine | Admitting: *Deleted

## 2012-02-17 ENCOUNTER — Encounter: Payer: Self-pay | Admitting: *Deleted

## 2012-02-17 VITALS — Wt 257.2 lb

## 2012-02-17 DIAGNOSIS — E119 Type 2 diabetes mellitus without complications: Secondary | ICD-10-CM

## 2012-02-17 DIAGNOSIS — Z713 Dietary counseling and surveillance: Secondary | ICD-10-CM | POA: Insufficient documentation

## 2012-02-17 NOTE — Progress Notes (Signed)
  Medical Nutrition Therapy:  Appt start time: 1130 end time:  1200.   Assessment:  Primary concerns today: diabetes.   MEDICATIONS: see list   DIETARY INTAKE:   Everyday foods include starches, sweets, proteins, and vegetables.  Avoided foods include none.     Usual physical activity: limited currently  Estimated energy needs: 1800 calories  200 g carbohydrates  135 g protein  50 g fat   Progress Towards Goal(s):  No progress.   Nutritional Diagnosis:  NB-1.6 Limited adherence to nutrition-related recommendations As related to diabetic meal plan.  As evidenced by self-reported noncompliance and weight gain.    Intervention:  Nutrition counseling provided.  Steven Schneider is here for a follow up appointment.  He admits to being noncompliant over the holiday seasons.  He stated he "forgtot about his diabetes".  He's not counting his carbs, eating way more sweets, not exercising, and not monitoring his glucose.  His wife says the holidays are an emotional time for them due to the death of a family member not many years ago.  Steven Schneider loves to overindulge on sweets.  Discussed the carb count for a variety of his favorite desserts/candies.  Instructed Steven Schneider to cut back on his carbs from other sources if he's going to eat sweets.  Also encouraged regular physical activity to help lower glucose.  Encouraged regular glucose monitoring so he can see how his body responds to the foods that he eats.   Handouts given during visit include:  Carb counting handout for sweets/sugars  Monitoring/Evaluation:  Dietary intake, exercise, BGM, and body weight prn.  Steven Schneider will follow up with new insurance company about nutrition appointment

## 2012-03-17 ENCOUNTER — Other Ambulatory Visit: Payer: Self-pay | Admitting: Urology

## 2012-03-18 NOTE — Pre-Procedure Instructions (Signed)
Asked to bring blue folder the day of the procedure,insurance card,I.D. driver's license,wear comfortable clothing and have a driver for the day. Asked not to take Advil,Motrin,Ibuprofen,Aleve or any NSAIDS, Aspirin, or Toradol for 72 hours prior to procedure,  No vitamins or herbal medications 7 days prior to procedure. Instructed to take laxative per doctor's office instructions and eat a light dinner the evening before procedure.   To arrive at 0530  for lithotripsy procedure. 

## 2012-03-19 ENCOUNTER — Encounter (HOSPITAL_COMMUNITY): Payer: Self-pay | Admitting: Pharmacy Technician

## 2012-03-27 NOTE — H&P (Signed)
History of Present Illness   [name] in December thinks he had a urinary tract infection. He has more blood in his urine. He is no longer on Coumadin. He has a 1 cm stone at the ureteropelvic junction on the right side. He has intermittent bilateral lower back pain, a little bit worse on the right.   His 2 urine cultures were negative.   He had lithotripsy in 1988. Cystoscopy was normal in June 2013, and he had red blood cells in his urine on that day. He had a CT scan on 08/19/2011 and had an 8 mm stone in the right renal pelvis with no obstruction.   Review of Systems: No change in bowel or neurologic systems.   I can see the stone on the right side in the renal pelvis.   In regards to the hematuria, there is no other modifying factors or associated signs or symptoms. There is no other aggravating or relieving factors. Symptoms are intermittent and milder in severity.   When he does get his back pain he gets some intermittent nausea as well.    Past Medical History Problems  1. History of  Diabetes Mellitus 250.00 2. History of  Fatigue 780.79 3. History of  Hematuria 599.70 4. History of  Hypogonadism 257.2 5. History of  Urinary Tract Infection V13.02  Surgical History Problems  1. History of  Knee Surgery  Current Meds 1. Robaxin 500 MG Oral Tablet; Therapy: (Recorded:26Aug2013) to  Allergies Medication  1. No Known Drug Allergies  Family History Problems  1. Paternal history of  Blood Disorders V18.3  Social History Problems  1. Caffeine Use 2. Marital History - Currently Married 3. Never A Smoker 4. Occupation: bus Psychologist, occupational  5. History of  Alcohol Use  Vitals Vital Signs [Data Includes: Last 1 Day]  03Jan2014 01:53PM  Blood Pressure: 141 / 86 Temperature: 98.3 F Heart Rate: 85  Results/Data  28 Feb 2012 1:43 PM   UA With REFLEX       COLOR YELLOW       APPEARANCE CLEAR       SPECIFIC GRAVITY 1.025       pH 5.5       GLUCOSE NEG   BILIRUBIN NEG       KETONE NEG       BLOOD LARGE       PROTEIN 30       UROBILINOGEN 0.2       NITRITE NEG       LEUKOCYTE ESTERASE TRACE       SQUAMOUS EPITHELIAL/HPF RARE       WBC 11-20       CRYSTALS NONE SEEN       CASTS NONE SEEN       RBC 11-20       BACTERIA FEW       Other MUCUS NOTED      Assessment Assessed  1. Gross Hematuria 599.71 2. Nephrolithiasis 592.0  Plan   Discussion/Summary   I drew [name] a picture. He understands that the stone is probably not related to his back pain, but it could be. It may be related to possible infections, but I have never actually gotten a positive culture on him. It may be because of the blood in his urine.   [name] and I, after drawing a picture, talked about lithotripsy.   We talked about ESWL in detail. Pros, cons, general surgical and anesthetic risks, and other options including  watchful waiting and ureteroscopy were discussed. Success and failure rates and need for further/repeat therapy were discussed. Risks were described but not limited to pain, infection, sepsis, and bleeding. The risk of renal and ureteral trauma with short and long term sequelae was discussed. The risk of injury to adjacent structures was discussed. The risk of needing a stent post-ESWL was discussed.  [name] chose watchful waiting. He understands that the pain is probably not due to the stone, but he could have a ball valve effect. Blood and/or infection might be. He understands that infections can be over-diagnosed. He is going to see his orthopedic doctor as well. I will see him in 6 months with a KUB to reassess the size of the stone.   After a thorough review of the management options for the patient's condition the patient  elected to proceed with surgical therapy as noted above. We have discussed the potential benefits and risks of the procedure, side effects of the proposed treatment, the likelihood of the patient achieving the goals of  the procedure, and any potential problems that might occur during the procedure or recuperation. Informed consent has been obtained.

## 2012-03-29 MED ORDER — CIPROFLOXACIN HCL 500 MG PO TABS
500.0000 mg | ORAL_TABLET | ORAL | Status: AC
  Administered 2012-03-30: 500 mg via ORAL
  Filled 2012-03-29: qty 1

## 2012-03-30 ENCOUNTER — Emergency Department (HOSPITAL_COMMUNITY)
Admission: EM | Admit: 2012-03-30 | Discharge: 2012-03-30 | Disposition: A | Discharge status: Home / Self Care | Payer: BC Managed Care – PPO | Attending: Emergency Medicine | Admitting: Emergency Medicine

## 2012-03-30 ENCOUNTER — Ambulatory Visit (HOSPITAL_COMMUNITY)
Admission: RE | Admit: 2012-03-30 | Discharge: 2012-03-30 | Disposition: A | Discharge status: Home / Self Care | Payer: BC Managed Care – PPO | Source: Ambulatory Visit | Attending: Urology | Admitting: Urology

## 2012-03-30 ENCOUNTER — Encounter (HOSPITAL_COMMUNITY): Payer: Self-pay | Admitting: *Deleted

## 2012-03-30 ENCOUNTER — Ambulatory Visit (HOSPITAL_COMMUNITY): Payer: BC Managed Care – PPO

## 2012-03-30 ENCOUNTER — Encounter (HOSPITAL_COMMUNITY)
Admission: RE | Disposition: A | Discharge status: Home / Self Care | Payer: Self-pay | Source: Ambulatory Visit | Attending: Urology

## 2012-03-30 ENCOUNTER — Encounter (HOSPITAL_COMMUNITY): Payer: Self-pay | Admitting: Emergency Medicine

## 2012-03-30 DIAGNOSIS — J45909 Unspecified asthma, uncomplicated: Secondary | ICD-10-CM | POA: Insufficient documentation

## 2012-03-30 DIAGNOSIS — Z8719 Personal history of other diseases of the digestive system: Secondary | ICD-10-CM | POA: Insufficient documentation

## 2012-03-30 DIAGNOSIS — E669 Obesity, unspecified: Secondary | ICD-10-CM | POA: Insufficient documentation

## 2012-03-30 DIAGNOSIS — N200 Calculus of kidney: Secondary | ICD-10-CM | POA: Insufficient documentation

## 2012-03-30 DIAGNOSIS — Z87898 Personal history of other specified conditions: Secondary | ICD-10-CM | POA: Insufficient documentation

## 2012-03-30 DIAGNOSIS — R109 Unspecified abdominal pain: Secondary | ICD-10-CM

## 2012-03-30 DIAGNOSIS — E119 Type 2 diabetes mellitus without complications: Secondary | ICD-10-CM | POA: Insufficient documentation

## 2012-03-30 DIAGNOSIS — Z87442 Personal history of urinary calculi: Secondary | ICD-10-CM | POA: Insufficient documentation

## 2012-03-30 DIAGNOSIS — R112 Nausea with vomiting, unspecified: Secondary | ICD-10-CM | POA: Insufficient documentation

## 2012-03-30 DIAGNOSIS — E785 Hyperlipidemia, unspecified: Secondary | ICD-10-CM | POA: Insufficient documentation

## 2012-03-30 DIAGNOSIS — Z8614 Personal history of Methicillin resistant Staphylococcus aureus infection: Secondary | ICD-10-CM | POA: Insufficient documentation

## 2012-03-30 DIAGNOSIS — R319 Hematuria, unspecified: Secondary | ICD-10-CM | POA: Insufficient documentation

## 2012-03-30 DIAGNOSIS — Z9889 Other specified postprocedural states: Secondary | ICD-10-CM | POA: Insufficient documentation

## 2012-03-30 LAB — URINE MICROSCOPIC-ADD ON

## 2012-03-30 LAB — URINALYSIS, ROUTINE W REFLEX MICROSCOPIC
Bilirubin Urine: NEGATIVE
Ketones, ur: >80 mg/dL — AB
Nitrite: NEGATIVE
Protein, ur: 100 mg/dL — AB
Urobilinogen, UA: 0.2 mg/dL (ref 0.0–1.0)
pH: 6.5 (ref 5.0–8.0)

## 2012-03-30 LAB — POCT I-STAT, CHEM 8
BUN: 11 mg/dL (ref 6–23)
Chloride: 106 mEq/L (ref 96–112)
Glucose, Bld: 190 mg/dL — ABNORMAL HIGH (ref 70–99)
HCT: 49 % (ref 39.0–52.0)
Potassium: 4 mEq/L (ref 3.5–5.1)

## 2012-03-30 LAB — CBC WITH DIFFERENTIAL/PLATELET
Basophils Absolute: 0 10*3/uL (ref 0.0–0.1)
Eosinophils Relative: 0 % (ref 0–5)
Lymphocytes Relative: 5 % — ABNORMAL LOW (ref 12–46)
MCV: 90.8 fL (ref 78.0–100.0)
Neutro Abs: 13.1 10*3/uL — ABNORMAL HIGH (ref 1.7–7.7)
Neutrophils Relative %: 90 % — ABNORMAL HIGH (ref 43–77)
Platelets: 243 10*3/uL (ref 150–400)
RDW: 12.7 % (ref 11.5–15.5)
WBC: 14.6 10*3/uL — ABNORMAL HIGH (ref 4.0–10.5)

## 2012-03-30 LAB — GLUCOSE, CAPILLARY: Glucose-Capillary: 149 mg/dL — ABNORMAL HIGH (ref 70–99)

## 2012-03-30 SURGERY — LITHOTRIPSY, ESWL
Anesthesia: LOCAL | Laterality: Right

## 2012-03-30 MED ORDER — SODIUM CHLORIDE 0.9 % IV BOLUS (SEPSIS)
1000.0000 mL | Freq: Once | INTRAVENOUS | Status: AC
  Administered 2012-03-30: 1000 mL via INTRAVENOUS

## 2012-03-30 MED ORDER — DEXTROSE-NACL 5-0.45 % IV SOLN
INTRAVENOUS | Status: DC
  Administered 2012-03-30: 1000 mL via INTRAVENOUS

## 2012-03-30 MED ORDER — PHENAZOPYRIDINE HCL 200 MG PO TABS
200.0000 mg | ORAL_TABLET | Freq: Three times a day (TID) | ORAL | Status: DC
  Administered 2012-03-30: 200 mg via ORAL
  Filled 2012-03-30: qty 1

## 2012-03-30 MED ORDER — HYDROMORPHONE HCL PF 1 MG/ML IJ SOLN
1.0000 mg | Freq: Once | INTRAMUSCULAR | Status: AC
  Administered 2012-03-30: 1 mg via INTRAVENOUS
  Filled 2012-03-30: qty 1

## 2012-03-30 MED ORDER — PHENAZOPYRIDINE HCL 95 MG PO TABS: 95.0000 mg | ORAL_TABLET | Freq: Three times a day (TID) | ORAL | Status: DC | PRN

## 2012-03-30 MED ORDER — HYDROCODONE-ACETAMINOPHEN 5-325 MG PO TABS
1.0000 | ORAL_TABLET | Freq: Four times a day (QID) | ORAL | Status: DC | PRN
  Administered 2012-03-30: 1 via ORAL
  Filled 2012-03-30: qty 1

## 2012-03-30 MED ORDER — PROMETHAZINE HCL 25 MG PO TABS: 25.0000 mg | ORAL_TABLET | Freq: Four times a day (QID) | ORAL | Status: DC | PRN

## 2012-03-30 MED ORDER — DIAZEPAM 5 MG PO TABS
10.0000 mg | ORAL_TABLET | ORAL | Status: AC
  Administered 2012-03-30: 10 mg via ORAL
  Filled 2012-03-30: qty 2

## 2012-03-30 MED ORDER — ONDANSETRON HCL 4 MG/2ML IJ SOLN
4.0000 mg | Freq: Once | INTRAMUSCULAR | Status: AC
  Administered 2012-03-30: 4 mg via INTRAVENOUS
  Filled 2012-03-30: qty 2

## 2012-03-30 MED ORDER — DIPHENHYDRAMINE HCL 25 MG PO CAPS
25.0000 mg | ORAL_CAPSULE | ORAL | Status: AC
  Administered 2012-03-30: 25 mg via ORAL
  Filled 2012-03-30: qty 1

## 2012-03-30 MED ORDER — MORPHINE SULFATE 30 MG PO TABS: 15.0000 mg | ORAL_TABLET | ORAL | Status: DC | PRN

## 2012-03-30 MED ORDER — SODIUM CHLORIDE 0.9 % IV SOLN
Freq: Once | INTRAVENOUS | Status: DC
Start: 2012-03-30 — End: 2012-03-30

## 2012-03-30 MED ORDER — SENNA-DOCUSATE SODIUM 8.6-50 MG PO TABS: 1.0000 | ORAL_TABLET | Freq: Every day | ORAL | Status: AC

## 2012-03-30 NOTE — Interval H&P Note (Signed)
History and Physical Interval Note:  03/30/2012 7:25 AM  Steven Schneider  has presented today for surgery, with the diagnosis of right ureteral stone  The various methods of treatment have been discussed with the patient and family. After consideration of risks, benefits and other options for treatment, the patient has consented to  Procedure(s) (LRB) with comments: EXTRACORPOREAL SHOCK WAVE LITHOTRIPSY (ESWL) (Right) as a surgical intervention .  The patient's history has been reviewed, patient examined, no change in status, stable for surgery.  I have reviewed the patient's chart and labs.  Questions were answered to the patient's satisfaction.     [name] A

## 2012-03-30 NOTE — ED Notes (Signed)
Pt from triage went straight to bathroom, when asked about urine sample pt sts he didn't know we need one.Pt sts he was able to urinate small amount and reports "lots of blood"in urine.

## 2012-03-30 NOTE — ED Notes (Signed)
Pt had a lithotripsy done this AM and now complains of pain and vomiting and inability to urinate.

## 2012-03-30 NOTE — Progress Notes (Signed)
Pt is discharged in care of wife mickey  .underpost op discharge, i may have charted home alone in error.

## 2012-03-30 NOTE — ED Provider Notes (Signed)
History     CSN: [id number]  Arrival date & time 03/30/12  1614   First MD Initiated Contact with Patient 03/30/12 1714      Chief Complaint  Patient presents with  . Dysuria    (Consider location/radiation/quality/duration/timing/severity/associated sxs/prior treatment) HPI  Steven Schneider is a 62 y.o. male complaining of severe left abdominal pain status post lithotripsy by [facility] this a.m. Patient has had 4 episodes of nonbloody nonbilious emesis today. He rates his pain at 10 out of 10. He is unable to take his Percocet secondary to vomiting. He also reports a feeling of incomplete voiding. And hematuria. Denies fever, chest pain, shortness of breath, change in bowel habits.   Past Medical History  Diagnosis Date  . Diabetes mellitus   . Hyperlipidemia   . Obesity   . PONV (postoperative nausea and vomiting)   . Chronic kidney disease     Kidney Stones- one in Left kidney now.  . Diverticulitis   . History of blood transfusion   . MRSA cellulitis   . Gynecomastia   . Seasonal allergies     Past Surgical History  Procedure Date  . Knee surgery 1980    ligament repair  . Kidney stone surgery 1989  . Lithotripsy   . Breast reduction surgery     for Gyneomastia  . Total knee arthroplasty 09/13/2011    Procedure: TOTAL KNEE ARTHROPLASTY;  Surgeon: Verlee Rossetti, MD;  Location: [facility] OR;  Service: Orthopedics;  Laterality: Left;  left total knee arthroplasty    No family history on file.  History  Substance Use Topics  . Smoking status: Never Smoker   . Smokeless tobacco: Not on file  . Alcohol Use: No     Comment: rare      Review of Systems  Constitutional: Negative for fever.  Respiratory: Negative for shortness of breath.   Cardiovascular: Negative for chest pain.  Gastrointestinal: Positive for nausea, vomiting and abdominal pain. Negative for diarrhea.  Genitourinary: Positive for hematuria.  All other systems reviewed and are  negative.    Allergies  Review of patient's allergies indicates no known allergies.  Home Medications   Current Outpatient Rx  Name  Route  Sig  Dispense  Refill  . HYDROCODONE-ACETAMINOPHEN 5-500 MG PO TABS   Oral   Take 1 tablet by mouth every 6 (six) hours as needed. For pain         . SYSTANE OP   Both Eyes   Place 2 drops into both eyes 3 (three) times daily as needed. For dry eyes           BP 138/82  Pulse 78  Temp 97.6 F (36.4 C) (Oral)  Resp 20  SpO2 100%  Physical Exam  Nursing note and vitals reviewed. Constitutional: He is oriented to person, place, and time. He appears well-developed and well-nourished. No distress.       Appears acutely uncomfortable  HENT:  Head: Normocephalic.  Mouth/Throat: Oropharynx is clear and moist.  Eyes: Conjunctivae normal and EOM are normal. Pupils are equal, round, and reactive to light.  Neck: Normal range of motion.  Cardiovascular: Normal rate, regular rhythm and intact distal pulses.   Pulmonary/Chest: Effort normal and breath sounds normal. No stridor. No respiratory distress. He has no wheezes. He has no rales. He exhibits no tenderness.  Abdominal: Soft. Bowel sounds are normal. He exhibits no distension and no mass. There is tenderness. There is no rebound and no guarding.  Tenderness to light palpation of the right upper and right lower quadrant, no hematomas  Musculoskeletal: Normal range of motion.  Neurological: He is alert and oriented to person, place, and time.  Psychiatric: He has a normal mood and affect.    ED Course  Procedures (including critical care time)  Labs Reviewed  URINALYSIS, ROUTINE W REFLEX MICROSCOPIC - Abnormal; Notable for the following:    Color, Urine RED (*)  BIOCHEMICALS MAY BE AFFECTED BY COLOR   APPearance CLOUDY (*)     Glucose, UA 100 (*)     Hgb urine dipstick LARGE (*)     Ketones, ur >80 (*)     Protein, ur 100 (*)     Leukocytes, UA SMALL (*)     All other  components within normal limits  URINE MICROSCOPIC-ADD ON - Abnormal; Notable for the following:    Bacteria, UA FEW (*)     All other components within normal limits  POCT I-STAT, CHEM 8 - Abnormal; Notable for the following:    Glucose, Bld 190 (*)     All other components within normal limits  CBC WITH DIFFERENTIAL - Abnormal; Notable for the following:    WBC 14.6 (*)     Neutrophils Relative 90 (*)     Neutro Abs 13.1 (*)     Lymphocytes Relative 5 (*)     All other components within normal limits  LAB REPORT - SCANNED   Dg Abd 1 View  03/30/2012  *RADIOLOGY REPORT*  Clinical Data: Preloaded hips.  Right renal calculus.  ABDOMEN - 1 VIEW  Comparison: 03/15/2010 CT abdomen and pelvis.  Findings: Suggestion of vague calcification projected over the right 12th rib which could represent renal stones or focal bone sclerosis.  Normal bowel gas pattern.  Calcified phleboliths in the pelvis. Visualized bones appear intact with mild degenerative changes present.  IMPRESSION: Ill-defined calcification over the right twelfth rib could represent renal stones or rib calcification.   Original Report Authenticated By: Burman Nieves, M.D.      1. Abdominal pain       MDM  Urinalysis was large hemoglobin as expected. Normal BUN and creatinine. H&H is 15 point 9/46 6 which is at his baseline he is likely hemoconcentrated secondary to dehydration from the vomiting. There is a mild leukocytosis of 14.6.   Urology consult from Dr. Margarita Grizzle appreciated: He recommends aggressive rehydration and pain control. Patient will be admitted to his service if pain cannot be alleviated.  Patient with significant pain relief and tolerating PO, vital signs stable and patient is appropriate for discharge at this time.    Pt verbalized understanding and agrees with care plan. Outpatient follow-up and return precautions given.    New Prescriptions   MORPHINE (MSIR) 30 MG TABLET    Take 0.5 tablets (15 mg total)  by mouth every 4 (four) hours as needed for pain.   PHENAZOPYRIDINE (PYRIDIUM) 95 MG TABLET    Take 1 tablet (95 mg total) by mouth 3 (three) times daily as needed for pain.   PROMETHAZINE (PHENERGAN) 25 MG TABLET    Take 1 tablet (25 mg total) by mouth every 6 (six) hours as needed for nausea.   SENNOSIDES-DOCUSATE SODIUM (SENOKOT-S) 8.6-50 MG TABLET    Take 1 tablet by mouth daily.          Wynetta Emery, PA-C 04/02/12 1947

## 2012-04-03 NOTE — ED Provider Notes (Signed)
Medical screening examination/treatment/procedure(s) were performed by non-physician practitioner and as supervising physician I was immediately available for consultation/collaboration.  Ethelda Chick, MD 04/03/12 [phone]

## 2013-07-13 IMAGING — CR DG ABDOMEN 1V
2 series · 2 of 2 positions shown · non-contrast
Comparison: 03/15/2010 CT abdomen and pelvis.

CLINICAL DATA: Preloaded hips.  Right renal calculus.

ABDOMEN - 1 VIEW

[t abdomen supine (1 of 2)]
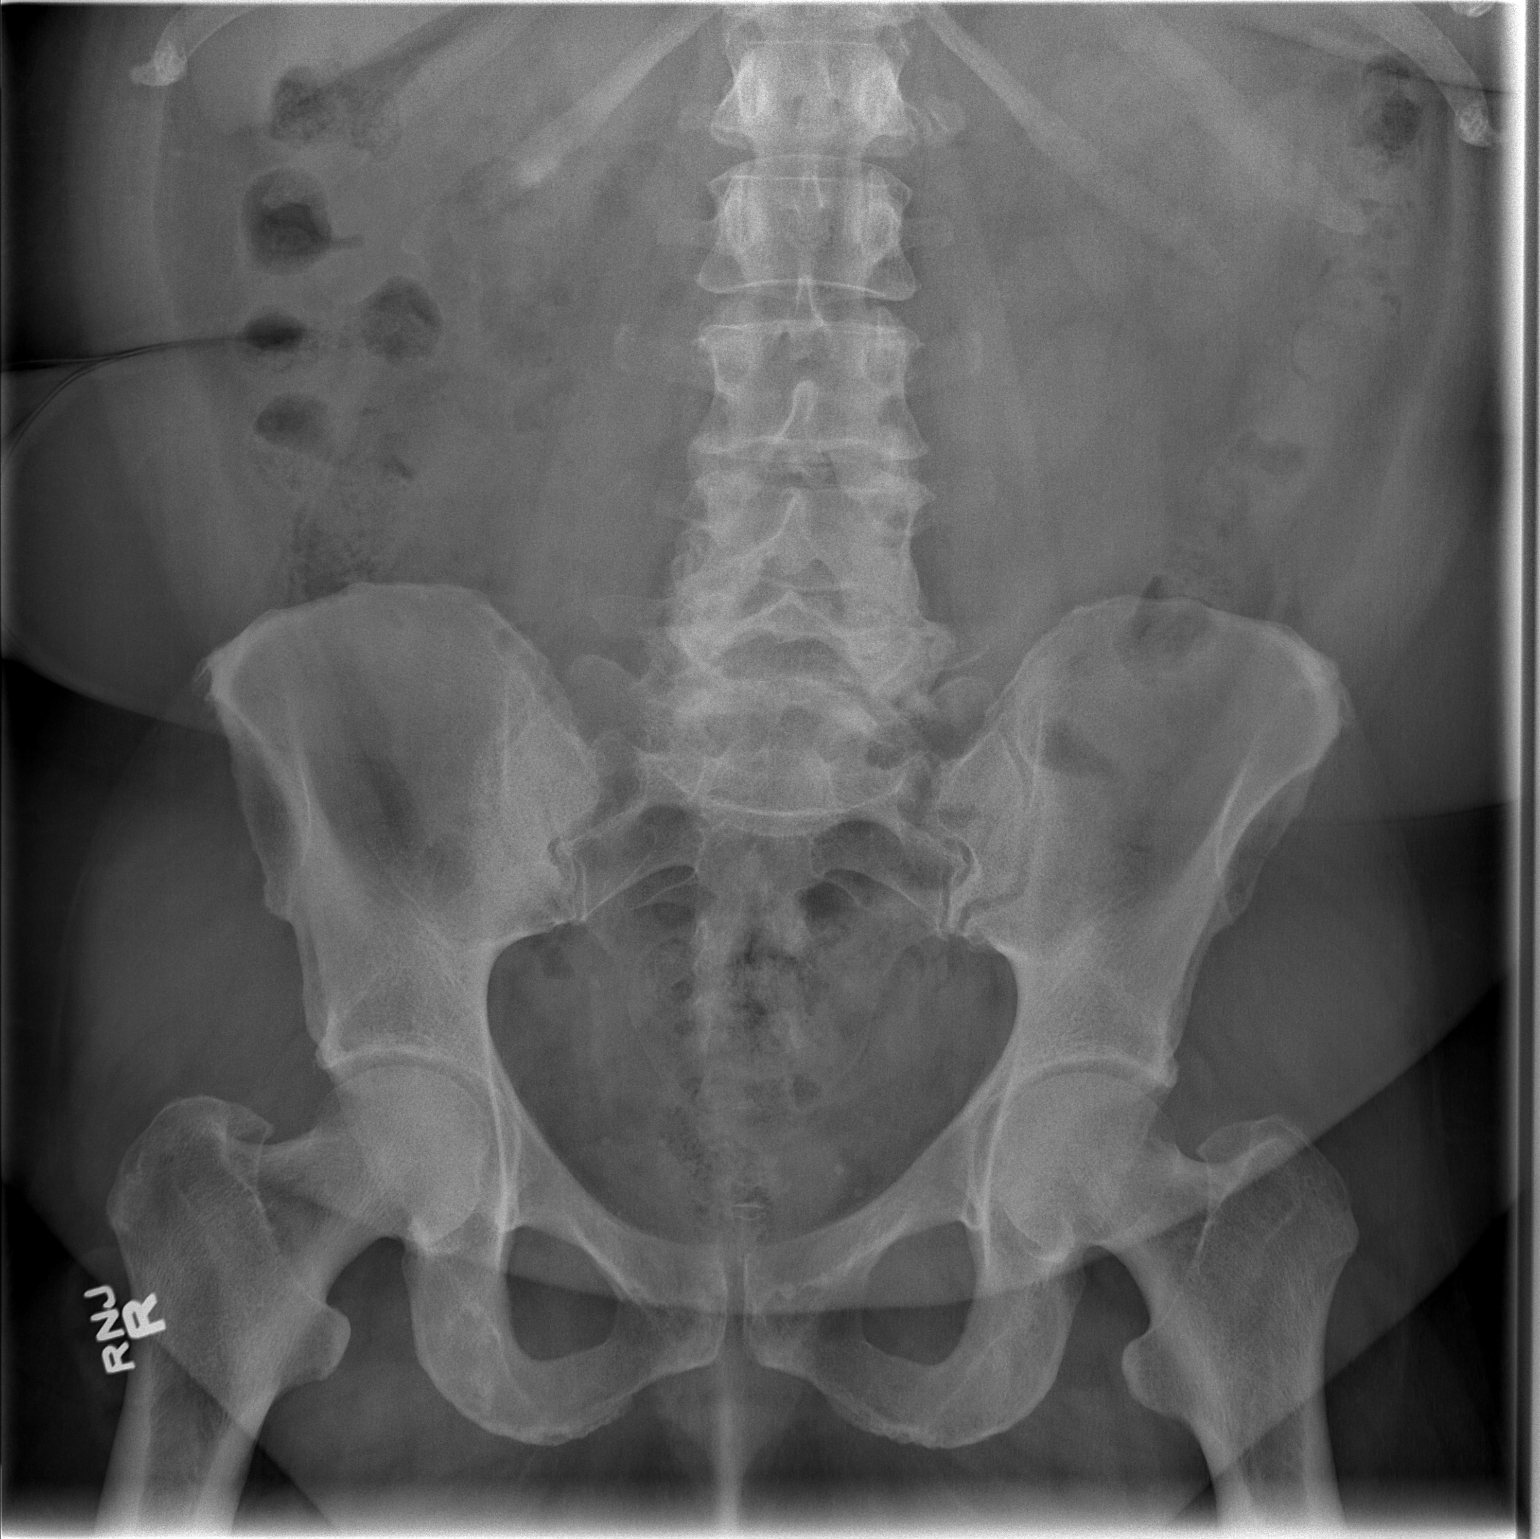

[t abdomen supine (2 of 2)]
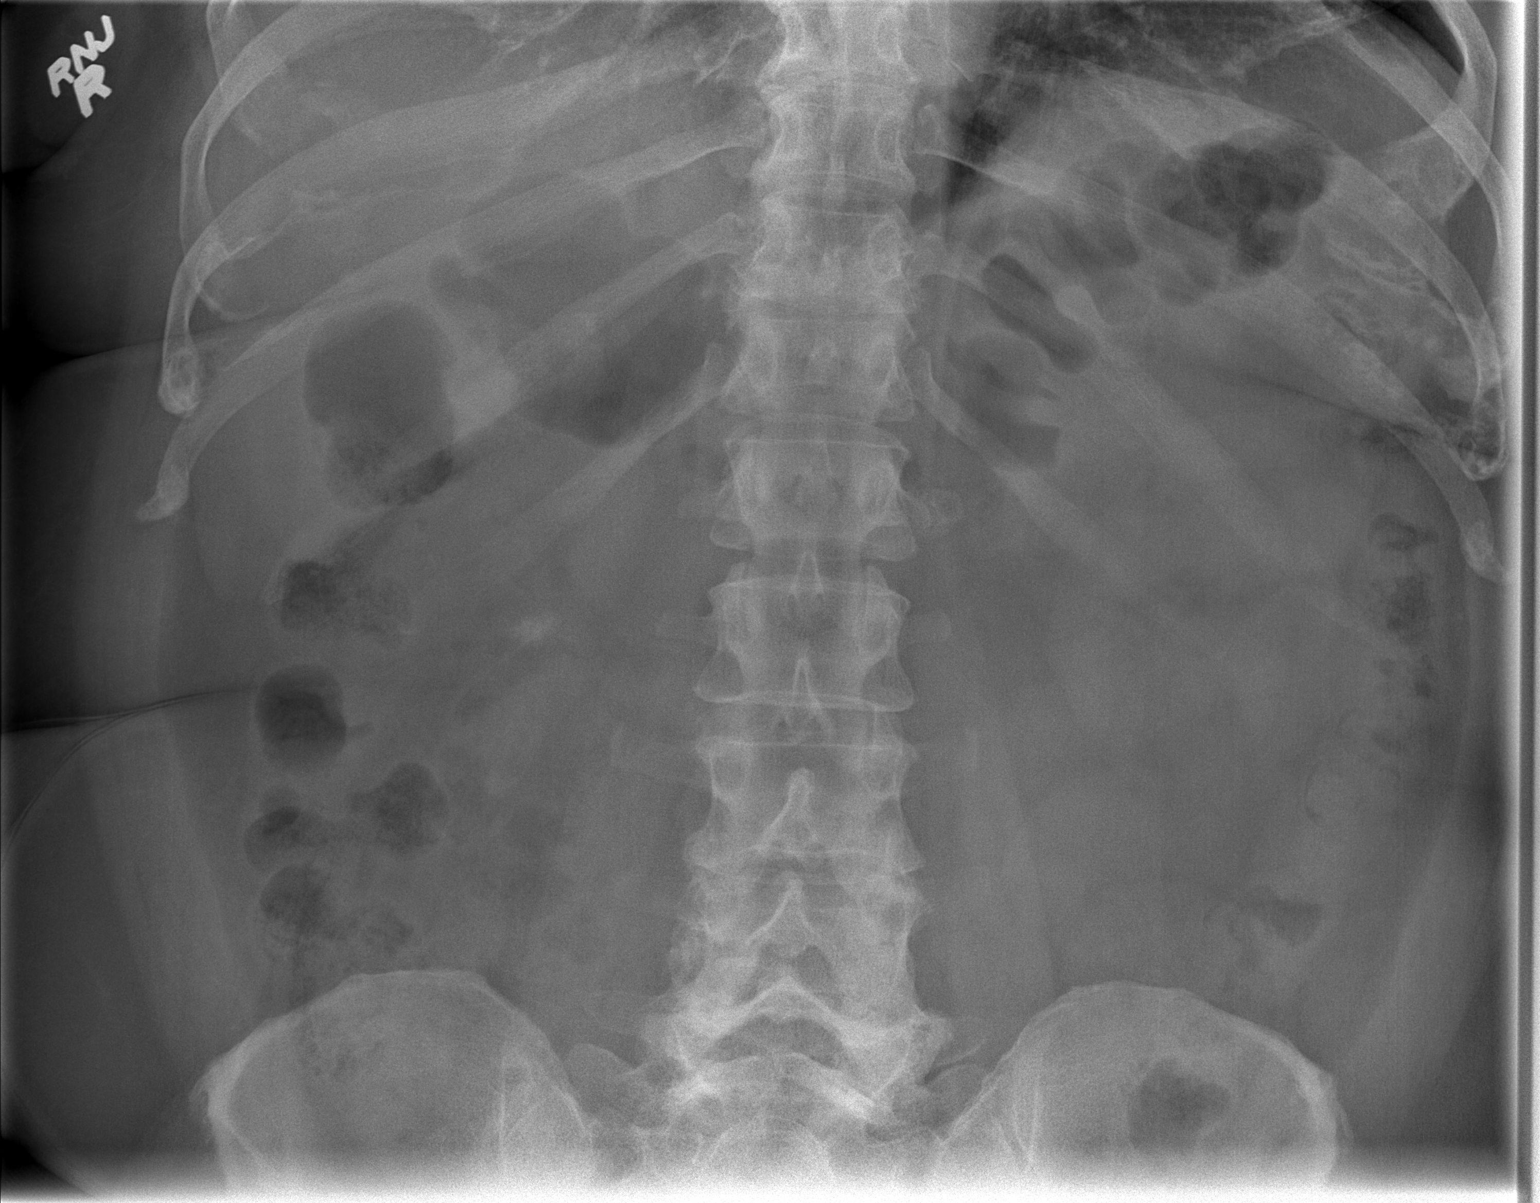

[2 of 2 positions shown; findings below may reference images not displayed]

FINDINGS: Suggestion of vague calcification projected over the
right 12th rib which could represent renal stones or focal bone
sclerosis.  Normal bowel gas pattern.  Calcified phleboliths in the
pelvis. Visualized bones appear intact with mild degenerative
changes present.
IMPRESSION: Ill-defined calcification over the right twelfth rib could
represent renal stones or rib calcification.

## 2014-01-06 ENCOUNTER — Emergency Department (HOSPITAL_BASED_OUTPATIENT_CLINIC_OR_DEPARTMENT_OTHER): Payer: BC Managed Care – PPO

## 2014-01-06 ENCOUNTER — Emergency Department (HOSPITAL_BASED_OUTPATIENT_CLINIC_OR_DEPARTMENT_OTHER)
Admission: EM | Admit: 2014-01-06 | Discharge: 2014-01-06 | Disposition: A | Discharge status: Home / Self Care | Payer: BC Managed Care – PPO | Attending: Emergency Medicine | Admitting: Emergency Medicine

## 2014-01-06 ENCOUNTER — Encounter (HOSPITAL_BASED_OUTPATIENT_CLINIC_OR_DEPARTMENT_OTHER): Payer: Self-pay

## 2014-01-06 DIAGNOSIS — E119 Type 2 diabetes mellitus without complications: Secondary | ICD-10-CM | POA: Diagnosis not present

## 2014-01-06 DIAGNOSIS — M5410 Radiculopathy, site unspecified: Secondary | ICD-10-CM

## 2014-01-06 DIAGNOSIS — Z8614 Personal history of Methicillin resistant Staphylococcus aureus infection: Secondary | ICD-10-CM | POA: Diagnosis not present

## 2014-01-06 DIAGNOSIS — Z79899 Other long term (current) drug therapy: Secondary | ICD-10-CM | POA: Diagnosis not present

## 2014-01-06 DIAGNOSIS — E785 Hyperlipidemia, unspecified: Secondary | ICD-10-CM | POA: Insufficient documentation

## 2014-01-06 DIAGNOSIS — E669 Obesity, unspecified: Secondary | ICD-10-CM | POA: Diagnosis not present

## 2014-01-06 DIAGNOSIS — Z8719 Personal history of other diseases of the digestive system: Secondary | ICD-10-CM | POA: Diagnosis not present

## 2014-01-06 DIAGNOSIS — M6281 Muscle weakness (generalized): Secondary | ICD-10-CM | POA: Diagnosis present

## 2014-01-06 DIAGNOSIS — N189 Chronic kidney disease, unspecified: Secondary | ICD-10-CM | POA: Insufficient documentation

## 2014-01-06 DIAGNOSIS — Z87898 Personal history of other specified conditions: Secondary | ICD-10-CM | POA: Diagnosis not present

## 2014-01-06 DIAGNOSIS — I129 Hypertensive chronic kidney disease with stage 1 through stage 4 chronic kidney disease, or unspecified chronic kidney disease: Secondary | ICD-10-CM | POA: Insufficient documentation

## 2014-01-06 LAB — CBG MONITORING, ED: GLUCOSE-CAPILLARY: 182 mg/dL — AB (ref 70–99)

## 2014-01-06 LAB — COMPREHENSIVE METABOLIC PANEL
ALBUMIN: 3.9 g/dL (ref 3.5–5.2)
ALK PHOS: 90 U/L (ref 39–117)
ALT: 41 U/L (ref 0–53)
ANION GAP: 11 (ref 5–15)
AST: 33 U/L (ref 0–37)
BUN: 16 mg/dL (ref 6–23)
CALCIUM: 9.5 mg/dL (ref 8.4–10.5)
CO2: 25 mEq/L (ref 19–32)
CREATININE: 0.9 mg/dL (ref 0.50–1.35)
Chloride: 103 mEq/L (ref 96–112)
GFR calc non Af Amer: 89 mL/min — ABNORMAL LOW (ref >90)
GLUCOSE: 197 mg/dL — AB (ref 70–99)
POTASSIUM: 4.2 meq/L (ref 3.7–5.3)
Sodium: 139 mEq/L (ref 137–147)
TOTAL PROTEIN: 7.2 g/dL (ref 6.0–8.3)
Total Bilirubin: 0.4 mg/dL (ref 0.3–1.2)

## 2014-01-06 LAB — CBC WITH DIFFERENTIAL/PLATELET
BASOS PCT: 0 % (ref 0–1)
Basophils Absolute: 0 10*3/uL (ref 0.0–0.1)
EOS ABS: 0.1 10*3/uL (ref 0.0–0.7)
EOS PCT: 1 % (ref 0–5)
HCT: 44.7 % (ref 39.0–52.0)
HEMOGLOBIN: 15.1 g/dL (ref 13.0–17.0)
LYMPHS ABS: 1.5 10*3/uL (ref 0.7–4.0)
Lymphocytes Relative: 14 % (ref 12–46)
MCH: 31.3 pg (ref 26.0–34.0)
MCHC: 33.8 g/dL (ref 30.0–36.0)
MCV: 92.5 fL (ref 78.0–100.0)
MONOS PCT: 5 % (ref 3–12)
Monocytes Absolute: 0.5 10*3/uL (ref 0.1–1.0)
NEUTROS PCT: 80 % — AB (ref 43–77)
Neutro Abs: 8.6 10*3/uL — ABNORMAL HIGH (ref 1.7–7.7)
PLATELETS: 217 10*3/uL (ref 150–400)
RBC: 4.83 MIL/uL (ref 4.22–5.81)
RDW: 12.7 % (ref 11.5–15.5)
WBC: 10.8 10*3/uL — ABNORMAL HIGH (ref 4.0–10.5)

## 2014-01-06 LAB — TROPONIN I

## 2014-01-06 MED ORDER — HYDROCODONE-ACETAMINOPHEN 5-325 MG PO TABS: 1.0000 | ORAL_TABLET | ORAL | Status: DC | PRN

## 2014-01-06 MED ORDER — ONDANSETRON HCL 4 MG/2ML IJ SOLN
4.0000 mg | Freq: Once | INTRAMUSCULAR | Status: AC
  Administered 2014-01-06: 4 mg via INTRAVENOUS
  Filled 2014-01-06: qty 2

## 2014-01-06 MED ORDER — IBUPROFEN 800 MG PO TABS
800.0000 mg | ORAL_TABLET | Freq: Once | ORAL | Status: DC
  Filled 2014-01-06: qty 1

## 2014-01-06 MED ORDER — MORPHINE SULFATE 4 MG/ML IJ SOLN
4.0000 mg | Freq: Once | INTRAMUSCULAR | Status: AC
  Administered 2014-01-06: 4 mg via INTRAVENOUS
  Filled 2014-01-06: qty 1

## 2014-01-06 MED ORDER — PREDNISONE 20 MG PO TABS: ORAL_TABLET | ORAL | Status: DC

## 2014-01-06 NOTE — ED Provider Notes (Signed)
CSN: [id number]     Arrival date & time 01/06/14  0849 History   First MD Initiated Contact with Patient 01/06/14 [phone]     Chief Complaint  Patient presents with  . Extremity Weakness     (Consider location/radiation/quality/duration/timing/severity/associated sxs/prior Treatment) HPI The patient reports he developed arm symptoms that are both sharp and achy in nature. He initially reported that the symptoms had started today. However over the past week he's had some variable aching and discomfort in the right arm. He has been attributing it to a crush injury of his third digit that occurred a week ago. The injury is actually quite self-limited to the digit area. This morning in particular though the pain shot up his arm in a lancinating type fashion to the back of his neck. Since that initial shooting pain the arm has felt achy and had a paresthesia quality to it. Of note over the course of the past week he has been wondering if he might have a pinched nerve. This morning symptoms however had in association with them some degree of nausea and lightheadedness. That symptom has resolved however the arm continues to be uncomfortable. The patient denies any chest pain or shortness of breath in association with these symptoms. He has not had fever or chills or cough. He took Vicodin last night for control of pain in the third digit however he doesn't find particularly helpful. His sutures were removed yesterday and Steri-Strips were placed Past Medical History  Diagnosis Date  . Diabetes mellitus   . Hyperlipidemia   . Obesity   . PONV (postoperative nausea and vomiting)   . Chronic kidney disease     Kidney Stones- one in Left kidney now.  . Diverticulitis   . History of blood transfusion   . MRSA cellulitis   . Gynecomastia   . Seasonal allergies    Past Surgical History  Procedure Laterality Date  . Knee surgery  1980    ligament repair  . Kidney stone surgery  1989  . Lithotripsy     . Breast reduction surgery      for Gyneomastia  . Total knee arthroplasty  09/13/2011    Procedure: TOTAL KNEE ARTHROPLASTY;  Surgeon: Augustin Schooling, MD;  Location: [facility];  Service: Orthopedics;  Laterality: Left;  left total knee arthroplasty   No family history on file. History  Substance Use Topics  . Smoking status: Never Smoker   . Smokeless tobacco: Not on file  . Alcohol Use: No     Comment: rare    Review of Systems  10 Systems reviewed and are negative for acute change except as noted in the HPI.   Allergies  Ibuprofen  Home Medications   Prior to Admission medications   Medication Sig Start Date End Date Taking? Authorizing Provider  DIGESTIVE ENZYMES PO Take by mouth.   Yes Historical Provider, MD  Omega 3-6-9 Fatty Acids (OMEGA 3-6-9 PO) Take by mouth.   Yes Historical Provider, MD  Vitamin D, Ergocalciferol, (DRISDOL) 50000 UNITS CAPS capsule Take 50,000 Units by mouth every 7 (seven) days.   Yes Historical Provider, MD  HYDROcodone-acetaminophen (NORCO/VICODIN) 5-325 MG per tablet Take 1-2 tablets by mouth every 4 (four) hours as needed for moderate pain or severe pain. 01/06/14   Charlesetta Shanks, MD  morphine (MSIR) 30 MG tablet Take 0.5 tablets (15 mg total) by mouth every 4 (four) hours as needed for pain. 03/30/12   Nicole Pisciotta, PA-C  phenazopyridine (PYRIDIUM) 95  MG tablet Take 1 tablet (95 mg total) by mouth 3 (three) times daily as needed for pain. 03/30/12   Nicole Pisciotta, PA-C  Polyethyl Glycol-Propyl Glycol (SYSTANE OP) Place 2 drops into both eyes 3 (three) times daily as needed. For dry eyes    Historical Provider, MD  predniSONE (DELTASONE) 20 MG tablet 3 tabs po day one, then 2 po daily x 4 days 01/06/14   Charlesetta Shanks, MD  promethazine (PHENERGAN) 25 MG tablet Take 1 tablet (25 mg total) by mouth every 6 (six) hours as needed for nausea. 03/30/12   Nicole Pisciotta, PA-C  sennosides-docusate sodium (SENOKOT-S) 8.6-50 MG tablet Take 1 tablet by  mouth daily. 03/30/12   Nicole Pisciotta, PA-C   BP 133/74 mmHg  Pulse 66  Temp(Src) 97.8 F (36.6 C) (Oral)  Resp 18  Ht 5\' 8"  (1.727 m)  Wt 271 lb (122.925 kg)  BMI 41.22 kg/m2  SpO2 98% Physical Exam  Constitutional: He is oriented to person, place, and time. He appears well-developed and well-nourished.  HENT:  Head: Normocephalic and atraumatic.  Eyes: EOM are normal. Pupils are equal, round, and reactive to light.  Neck: Neck supple.  The patient has a focus of tenderness on the right paraspinous muscle bodies at approximately the C3-C4 level. He does endorse this as a point of pain generation for him. He does have normal range of motion of the neck. The neck is otherwise supple with normal soft tissue palpation.  Cardiovascular: Normal rate, regular rhythm, normal heart sounds and intact distal pulses.   Pulmonary/Chest: Effort normal and breath sounds normal.  Abdominal: Soft. Bowel sounds are normal. He exhibits no distension. There is no tenderness.  Musculoskeletal: Normal range of motion. He exhibits no edema.  The palpation of the soft tissues of the right upper arm near normal. There is a normal pulse and inspection of the third digit shows it to have no swelling edema or cellulitis in association with it. They're clean dry Steri-Strips over a well-healing wound.  Neurological: He is alert and oriented to person, place, and time. He has normal strength. Coordination normal. GCS eye subscore is 4. GCS verbal subscore is 5. GCS motor subscore is 6.  Strength testing of the upper extremities is normal. Sensation is intact although the patient perceivesparesthesia.  Skin: Skin is warm, dry and intact.  Psychiatric: He has a normal mood and affect.    ED Course  Procedures (including critical care time) Labs Review Labs Reviewed  CBC WITH DIFFERENTIAL - Abnormal; Notable for the following:    WBC 10.8 (*)    Neutrophils Relative % 80 (*)    Neutro Abs 8.6 (*)    All other  components within normal limits  COMPREHENSIVE METABOLIC PANEL - Abnormal; Notable for the following:    Glucose, Bld 197 (*)    GFR calc non Af Amer 89 (*)    All other components within normal limits  TROPONIN I    Imaging Review Dg Chest 2 View  01/06/2014   CLINICAL DATA:  Headache and upper extremity weakness  EXAM: CHEST  2 VIEW  COMPARISON:  September 13, 2011  FINDINGS: There is no edema or consolidation. The heart size and pulmonary vascularity are normal. No adenopathy There is degenerative change in the thoracic spine.  IMPRESSION: No edema or consolidation.   Electronically Signed   By: Lowella Grip M.D.   On: 01/06/2014 10:15     EKG Interpretation   Date/Time:  Thursday January 06 2014 09:12:19 EST Ventricular Rate:  73 PR Interval:  148 QRS Duration: 82 QT Interval:  402 QTC Calculation: 442 R Axis:   34 Text Interpretation:  Normal sinus rhythm with sinus arrhythmia Normal ECG  agree. no change Confirmed by Johnney Killian, MD, Jeannie Done [phone]) on 01/06/2014  12:44:03 PM      MDM   Final diagnoses:  Radiculopathy affecting upper extremity    At this point findings are not suggestive of stroke related symptoms. The initiation was with a sharp lancinating type of pain and ongoing aching discomfort in the arm up to the cervical spine. There is a reproducible focus of discomfort at the cervical spine. The neurologic examination of the arm is for normal motor function and intact sensation although the patient perceives paresthesia. There is a normal vascular examination of the arm as well. At this time patient will be treated with a prednisone taper and Vicodin for pain control. He is advised to return should his symptoms change or worsen in anyway or new symptoms would develop.   Charlesetta Shanks, MD 01/06/14 [phone]

## 2014-01-06 NOTE — ED Notes (Signed)
Pt declines Ibuprofen; states it makes him congested.

## 2014-01-06 NOTE — Discharge Instructions (Signed)

## 2014-01-06 NOTE — ED Notes (Signed)
States he was getting ready for work and developed a sharp, shooting pain in right arm radiating to neck and head, then arm and hand felt weak and tingling.  Felt "swimmy headed".

## 2014-01-06 NOTE — ED Notes (Signed)
MD at bedside. 

## 2014-04-04 DIAGNOSIS — Z789 Other specified health status: Secondary | ICD-10-CM | POA: Insufficient documentation

## 2014-08-23 DIAGNOSIS — G4733 Obstructive sleep apnea (adult) (pediatric): Secondary | ICD-10-CM | POA: Insufficient documentation

## 2014-11-14 DIAGNOSIS — I10 Essential (primary) hypertension: Secondary | ICD-10-CM | POA: Insufficient documentation

## 2016-08-19 DIAGNOSIS — E785 Hyperlipidemia, unspecified: Secondary | ICD-10-CM | POA: Insufficient documentation

## 2018-02-16 DIAGNOSIS — Z96652 Presence of left artificial knee joint: Secondary | ICD-10-CM | POA: Insufficient documentation

## 2018-03-06 DIAGNOSIS — M1711 Unilateral primary osteoarthritis, right knee: Secondary | ICD-10-CM | POA: Insufficient documentation

## 2019-09-15 ENCOUNTER — Encounter: Payer: Self-pay | Admitting: Physician Assistant

## 2019-09-15 ENCOUNTER — Ambulatory Visit (INDEPENDENT_AMBULATORY_CARE_PROVIDER_SITE_OTHER): Payer: BC Managed Care – PPO | Admitting: Physician Assistant

## 2019-09-15 ENCOUNTER — Other Ambulatory Visit: Payer: Self-pay

## 2019-09-15 DIAGNOSIS — L820 Inflamed seborrheic keratosis: Secondary | ICD-10-CM

## 2019-09-15 DIAGNOSIS — D485 Neoplasm of uncertain behavior of skin: Secondary | ICD-10-CM

## 2019-09-15 NOTE — Progress Notes (Signed)
   Follow up Visit  Subjective  Steven Schneider is a 69 y.o. male who presents for the following: Skin Problem (dark spot on scalp, unsure how long its been present, recently irritating tingling and itching). Spot on scalp that is irritating and has begun to burn and sting.   Objective  Well appearing patient in no apparent distress; mood and affect are within normal limits.  A focused examination was performed including scalp and face. Relevant physical exam findings are noted in the Assessment and Plan.   Objective  Right Buccal Cheek  (2): Erythematous stuck-on, waxy papule or plaque.   Objective  Mid Parietal Scalp: Scabbed warty papule     Assessment & Plan  Inflamed seborrheic keratosis (2) Right Buccal Cheek   Destruction of lesion - Right Buccal Cheek  Complexity: simple   Destruction method: cryotherapy   Informed consent: discussed and consent obtained   Timeout:  patient name, date of birth, surgical site, and procedure verified Lesion destroyed using liquid nitrogen: Yes   Outcome: patient tolerated procedure well with no complications    Neoplasm of uncertain behavior of skin Mid Parietal Scalp  Skin / nail biopsy Type of biopsy: tangential   Informed consent: discussed and consent obtained   Timeout: patient name, date of birth, surgical site, and procedure verified   Procedure prep:  Patient was prepped and draped in usual sterile fashion (Non sterile) Prep type:  Chlorhexidine Anesthesia: the lesion was anesthetized in a standard fashion   Anesthetic:  1% lidocaine w/ epinephrine 1-100,000 local infiltration Instrument used: flexible razor blade   Outcome: patient tolerated procedure well   Post-procedure details: wound care instructions given    Specimen 1 - Surgical pathology Differential Diagnosis: Scabbed warty papule Check Margins: No

## 2019-09-15 NOTE — Patient Instructions (Signed)

## 2019-10-27 ENCOUNTER — Other Ambulatory Visit: Payer: Self-pay

## 2019-10-27 ENCOUNTER — Ambulatory Visit (INDEPENDENT_AMBULATORY_CARE_PROVIDER_SITE_OTHER): Payer: BC Managed Care – PPO | Admitting: Dermatology

## 2019-10-27 DIAGNOSIS — L309 Dermatitis, unspecified: Secondary | ICD-10-CM | POA: Diagnosis not present

## 2019-10-27 DIAGNOSIS — Z872 Personal history of diseases of the skin and subcutaneous tissue: Secondary | ICD-10-CM

## 2019-10-27 MED ORDER — TRIAMCINOLONE ACETONIDE 0.1 % EX CREA: 1.0000 "application " | TOPICAL_CREAM | Freq: Two times a day (BID) | CUTANEOUS | 2 refills | Status: AC | PRN

## 2019-10-27 NOTE — Progress Notes (Signed)
Previous treatment clobetasol and halobetasol per chart, also November/febuary left sideburn refreeze

## 2019-11-22 ENCOUNTER — Encounter: Payer: Self-pay | Admitting: Dermatology

## 2019-11-22 NOTE — Progress Notes (Signed)
   Follow-Up Visit   Subjective  Steven Schneider is a 69 y.o. male who presents for the following: Rash (Around neck started about a week ago. it was red and itchy. It has since got better but patient has photos of what it looked like at its worst. ).  Rash Location: Mostly neck Duration:  Quality: Improving Associated Signs/Symptoms: Modifying Factors: Provided excellent photographs of visible urticarial dermatitis Severity:  Timing: Context: History of previous rashes treated with class I potency topical corticosteroids.  Objective  Well appearing patient in no apparent distress; mood and affect are within normal limits.  A focused examination was performed including Head, neck, upper torso.. Relevant physical exam findings are noted in the Assessment and Plan.   Assessment & Plan    Dermatitis (2) Neck - Anterior; Neck - Posterior  We will try a milder topical corticosteroid (triamcinolone) daily for 2 to 3 weeks.  Consider patch testing if rash recurs.     I, Lavonna Monarch, MD, have reviewed all documentation for this visit.  The documentation on 11/22/19 for the exam, diagnosis, procedures, and orders are all accurate and complete.

## 2019-12-08 ENCOUNTER — Encounter (HOSPITAL_BASED_OUTPATIENT_CLINIC_OR_DEPARTMENT_OTHER): Payer: Self-pay

## 2019-12-08 ENCOUNTER — Emergency Department (HOSPITAL_BASED_OUTPATIENT_CLINIC_OR_DEPARTMENT_OTHER): Payer: BC Managed Care – PPO

## 2019-12-08 ENCOUNTER — Other Ambulatory Visit: Payer: Self-pay

## 2019-12-08 ENCOUNTER — Emergency Department (HOSPITAL_BASED_OUTPATIENT_CLINIC_OR_DEPARTMENT_OTHER)
Admission: EM | Admit: 2019-12-08 | Discharge: 2019-12-08 | Disposition: A | Discharge status: Home / Self Care | Payer: BC Managed Care – PPO | Attending: Emergency Medicine | Admitting: Emergency Medicine

## 2019-12-08 DIAGNOSIS — N189 Chronic kidney disease, unspecified: Secondary | ICD-10-CM | POA: Insufficient documentation

## 2019-12-08 DIAGNOSIS — E1165 Type 2 diabetes mellitus with hyperglycemia: Secondary | ICD-10-CM | POA: Insufficient documentation

## 2019-12-08 DIAGNOSIS — R059 Cough, unspecified: Secondary | ICD-10-CM | POA: Diagnosis present

## 2019-12-08 DIAGNOSIS — U071 COVID-19: Secondary | ICD-10-CM | POA: Insufficient documentation

## 2019-12-08 DIAGNOSIS — Z96652 Presence of left artificial knee joint: Secondary | ICD-10-CM | POA: Diagnosis not present

## 2019-12-08 LAB — CBC WITH DIFFERENTIAL/PLATELET
Abs Immature Granulocytes: 0.06 10*3/uL (ref 0.00–0.07)
Basophils Absolute: 0 10*3/uL (ref 0.0–0.1)
Basophils Relative: 0 %
Eosinophils Absolute: 0.1 10*3/uL (ref 0.0–0.5)
Eosinophils Relative: 1 %
HCT: 46 % (ref 39.0–52.0)
Hemoglobin: 16.2 g/dL (ref 13.0–17.0)
Immature Granulocytes: 1 %
Lymphocytes Relative: 14 %
Lymphs Abs: 1.5 10*3/uL (ref 0.7–4.0)
MCH: 31.2 pg (ref 26.0–34.0)
MCHC: 35.2 g/dL (ref 30.0–36.0)
MCV: 88.5 fL (ref 80.0–100.0)
Monocytes Absolute: 1.1 10*3/uL — ABNORMAL HIGH (ref 0.1–1.0)
Monocytes Relative: 11 %
Neutro Abs: 7.8 10*3/uL — ABNORMAL HIGH (ref 1.7–7.7)
Neutrophils Relative %: 73 %
Platelets: 282 10*3/uL (ref 150–400)
RBC: 5.2 MIL/uL (ref 4.22–5.81)
RDW: 12 % (ref 11.5–15.5)
WBC: 10.5 10*3/uL (ref 4.0–10.5)
nRBC: 0 % (ref 0.0–0.2)

## 2019-12-08 LAB — COMPREHENSIVE METABOLIC PANEL
ALT: 22 U/L (ref 0–44)
AST: 24 U/L (ref 15–41)
Albumin: 3.2 g/dL — ABNORMAL LOW (ref 3.5–5.0)
Alkaline Phosphatase: 47 U/L (ref 38–126)
Anion gap: 15 (ref 5–15)
BUN: 18 mg/dL (ref 8–23)
CO2: 20 mmol/L — ABNORMAL LOW (ref 22–32)
Calcium: 9.4 mg/dL (ref 8.9–10.3)
Chloride: 104 mmol/L (ref 98–111)
Creatinine, Ser: 0.86 mg/dL (ref 0.61–1.24)
GFR, Estimated: >60 mL/min (ref >60)
Glucose, Bld: 141 mg/dL — ABNORMAL HIGH (ref 70–99)
Potassium: 3.3 mmol/L — ABNORMAL LOW (ref 3.5–5.1)
Sodium: 139 mmol/L (ref 135–145)
Total Bilirubin: 1.2 mg/dL (ref 0.3–1.2)
Total Protein: 7 g/dL (ref 6.5–8.1)

## 2019-12-08 LAB — RESP PANEL BY RT PCR (RSV, FLU A&B, COVID)
Influenza A by PCR: NEGATIVE
Influenza B by PCR: NEGATIVE
Respiratory Syncytial Virus by PCR: NEGATIVE
SARS Coronavirus 2 by RT PCR: POSITIVE — AB

## 2019-12-08 LAB — LIPASE, BLOOD: Lipase: 36 U/L (ref 11–51)

## 2019-12-08 MED ORDER — SODIUM CHLORIDE 0.9 % IV BOLUS
1000.0000 mL | Freq: Once | INTRAVENOUS | Status: AC
  Administered 2019-12-08: 1000 mL via INTRAVENOUS

## 2019-12-08 MED ORDER — BENZONATATE 100 MG PO CAPS: 100.0000 mg | ORAL_CAPSULE | Freq: Three times a day (TID) | ORAL | 0 refills | Status: AC

## 2019-12-08 MED ORDER — ONDANSETRON HCL 4 MG PO TABS: 4.0000 mg | ORAL_TABLET | Freq: Four times a day (QID) | ORAL | 0 refills | Status: AC

## 2019-12-08 MED ORDER — POTASSIUM CHLORIDE CRYS ER 20 MEQ PO TBCR
40.0000 meq | EXTENDED_RELEASE_TABLET | Freq: Once | ORAL | Status: AC
  Administered 2019-12-08: 40 meq via ORAL
  Filled 2019-12-08: qty 2

## 2019-12-08 MED ORDER — ACETAMINOPHEN 325 MG PO TABS
650.0000 mg | ORAL_TABLET | Freq: Once | ORAL | Status: AC
  Administered 2019-12-08: 650 mg via ORAL
  Filled 2019-12-08: qty 2

## 2019-12-08 MED ORDER — ONDANSETRON HCL 4 MG/2ML IJ SOLN
4.0000 mg | Freq: Once | INTRAMUSCULAR | Status: AC
  Administered 2019-12-08: 4 mg via INTRAVENOUS
  Filled 2019-12-08: qty 2

## 2019-12-08 NOTE — ED Notes (Signed)
Pt proceeds to state he has been having symptoms since October 1st. Hasnt been able to eat much food, able to hold down fluids.

## 2019-12-08 NOTE — ED Provider Notes (Signed)
[facility] EMERGENCY DEPARTMENT Provider Note   CSN: [id number] Arrival date & time: 12/08/19  1420     History Chief Complaint  Patient presents with  . Weakness  . Diarrhea    Steven Schneider is a 69 y.o. male.  HPI   69 year old male with history of CKD, diabetes, diverticulitis, hyperlipidemia, cellulitis, allergies, who presents the emergency department today for evaluation complaints.  Patient states that he has been having a cough, diarrhea, generalized weakness and nausea for the last 2 weeks.  He has had some mild shortness of breath.  He denies any chest pain.  He denies any abdominal pain.  He has had intermittent episodes of vomiting.  He has about 1 episode of diarrhea per day.  He denies any urinary symptoms.  He has had no known Covid exposures.  He has not been vaccinated.  Past Medical History:  Diagnosis Date  . Chronic kidney disease    Kidney Stones- one in Left kidney now.  . Diabetes mellitus   . Diverticulitis   . Gynecomastia   . History of blood transfusion   . Hyperlipidemia   . MRSA cellulitis   . Obesity   . PONV (postoperative nausea and vomiting)   . Seasonal allergies     Patient Active Problem List   Diagnosis Date Noted  . Severe obesity (BMI 35.0-39.9) with comorbidity ([place]) 09/15/2019  . Primary osteoarthritis of right knee 03/06/2018  . History of total knee replacement, left 02/16/2018  . Hyperlipidemia associated with type 2 diabetes mellitus ([place]) 08/19/2016  . Benign essential HTN 11/14/2014  . Obstructive sleep apnea syndrome, moderate 08/23/2014  . Male-to-male transgender person 04/04/2014  . Type 2 diabetes mellitus with hyperglycemia, without long-term current use of insulin ([place]) 07/17/2011    Past Surgical History:  Procedure Laterality Date  . BREAST REDUCTION SURGERY     for [facility]  . [place]  . KNEE SURGERY  1980   ligament repair  . LITHOTRIPSY    . TOTAL KNEE ARTHROPLASTY   09/13/2011   Procedure: TOTAL KNEE ARTHROPLASTY;  Surgeon: Augustin Schooling, MD;  Location: [place];  Service: Orthopedics;  Laterality: Left;  left total knee arthroplasty       History reviewed. No pertinent family history.  Social History   Tobacco Use  . Smoking status: Never Smoker  . Smokeless tobacco: Never Used  Substance Use Topics  . Alcohol use: No    Comment: rare  . Drug use: No    Home Medications Prior to Admission medications   Medication Sig Start Date End Date Taking? Authorizing Provider  benzonatate (TESSALON) 100 MG capsule Take 1 capsule (100 mg total) by mouth every 8 (eight) hours for 5 days. 12/08/19 12/13/19  Manette Doto S, PA-C  DIGESTIVE ENZYMES PO Take by mouth.    [provider]  Omega 3-6-9 Fatty Acids (OMEGA 3-6-9 PO) Take by mouth.    [provider]  ondansetron (ZOFRAN) 4 MG tablet Take 1 tablet (4 mg total) by mouth every 6 (six) hours. 12/08/19   Jeryn Schneider S, PA-C  Polyethyl Glycol-Propyl Glycol (SYSTANE OP) Place 2 drops into both eyes 3 (three) times daily as needed. For dry eyes    [provider]  sennosides-docusate sodium (SENOKOT-S) 8.6-50 MG tablet Take 1 tablet by mouth daily. 03/30/12   Pisciotta, Elmyra Ricks, PA-C  triamcinolone cream (KENALOG) 0.1 % Apply 1 application topically 2 (two) times daily as needed. 10/27/19   Tafeen,  Tressie Ellis, MD  Vitamin D, Ergocalciferol, (DRISDOL) 50000 UNITS CAPS capsule Take 50,000 Units by mouth every 7 (seven) days.    [provider]    Allergies    Ibuprofen  Review of Systems   Review of Systems  Constitutional: Positive for appetite change, chills, fatigue and fever (subjective).  HENT: Negative for ear pain and sore throat.   Eyes: Negative for pain and visual disturbance.  Respiratory: Positive for cough and shortness of breath.   Cardiovascular: Negative for chest pain.  Gastrointestinal: Positive for diarrhea, nausea and vomiting. Negative for abdominal  pain.  Genitourinary: Negative for dysuria and hematuria.  Musculoskeletal: Negative for back pain.  Skin: Negative for rash.  Neurological: Negative for seizures and syncope.  All other systems reviewed and are negative.   Physical Exam Updated Vital Signs BP (!) 143/90 (BP Location: Right Arm)   Pulse 98   Temp 99.8 F (37.7 C) (Oral)   Resp 20   Ht 5\' 8"  (1.727 m)   Wt 92.5 kg   SpO2 96%   BMI 31.02 kg/m   Physical Exam Vitals and nursing note reviewed.  Constitutional:      General: He is not in acute distress.    Appearance: He is well-developed. He is not ill-appearing or toxic-appearing.  HENT:     Head: Normocephalic and atraumatic.     Mouth/Throat:     Mouth: Mucous membranes are dry.  Eyes:     Conjunctiva/sclera: Conjunctivae normal.  Cardiovascular:     Rate and Rhythm: Normal rate and regular rhythm.     Heart sounds: Normal heart sounds. No murmur heard.   Pulmonary:     Effort: Pulmonary effort is normal. No respiratory distress.     Breath sounds: Normal breath sounds. No wheezing, rhonchi or rales.  Abdominal:     General: Bowel sounds are normal.     Palpations: Abdomen is soft.     Tenderness: There is abdominal tenderness (mild, epigastric ttp). There is no guarding or rebound.  Musculoskeletal:     Cervical back: Neck supple.  Skin:    General: Skin is warm and dry.  Neurological:     Mental Status: He is alert.     ED Results / Procedures / Treatments   Labs (all labs ordered are listed, but only abnormal results are displayed) Labs Reviewed  RESP PANEL BY RT PCR (RSV, FLU A&B, COVID) - Abnormal; Notable for the following components:      Result Value   SARS Coronavirus 2 by RT PCR POSITIVE (*)    All other components within normal limits  CBC WITH DIFFERENTIAL/PLATELET - Abnormal; Notable for the following components:   Neutro Abs 7.8 (*)    Monocytes Absolute 1.1 (*)    All other components within normal limits  COMPREHENSIVE  METABOLIC PANEL - Abnormal; Notable for the following components:   Potassium 3.3 (*)    CO2 20 (*)    Glucose, Bld 141 (*)    Albumin 3.2 (*)    All other components within normal limits  LIPASE, BLOOD    EKG None  Radiology DG Chest Portable 1 View  Result Date: 12/08/2019 CLINICAL DATA:  69 year old male with cough and weakness. EXAM: PORTABLE CHEST 1 VIEW COMPARISON:  Chest radiograph dated 01/06/2014. FINDINGS: Faint streaky and confluent densities in the left lung and right lung base most concerning for infiltrate. Clinical correlation is recommended. No lobar consolidation, pleural effusion, or pneumothorax. The cardiac silhouette is within limits. No  acute osseous pathology. IMPRESSION: Findings concerning for developing infiltrates bilaterally. Electronically Signed   By: Anner Crete M.D.   On: 12/08/2019 15:35    Procedures Procedures (including critical care time)  Medications Ordered in ED Medications  acetaminophen (TYLENOL) tablet 650 mg (650 mg Oral Given 12/08/19 1502)  ondansetron (ZOFRAN) injection 4 mg (4 mg Intravenous Given 12/08/19 1458)  sodium chloride 0.9 % bolus 1,000 mL (0 mLs Intravenous Stopped 12/08/19 1626)  potassium chloride SA (KLOR-CON) CR tablet 40 mEq (40 mEq Oral Given 12/08/19 1632)    ED Course  I have reviewed the triage vital signs and the nursing notes.  Pertinent labs & imaging results that were available during my care of the patient were reviewed by me and considered in my medical decision making (see chart for details).    MDM Rules/Calculators/A&P                          69 year old male here for evaluation of cough, nausea, diarrhea, generalized weakness ongoing for the last 2 weeks.  Unvaccinated against COVID.  Has had no known exposures.  Reviewed/interpreted his lab CBC is without leukocytosis or anemia CMP with mild hypokalemia, low bicarb, normal LFTs and bilirubin.  Albumin slightly low.   -P.o. K given Lipase  normal Covid is positive  Chest x-ray reviewed/interpreted and shows developing bilateral infiltrates, consistent with COVID-19.  Patient was given IV fluid, Zofran, Tylenol and potassium.  On reassessment he states he feels much improved from when he first got here.  He is eating on my reassessment and tolerating this well.  He was ambulated about the room and sats did not drop below 95%.  He is well-appearing and does not appear to have any emergent indication for admission at this time.  Feel he is appropriate for discharge home with close follow-up with his PCP and strict return precautions.  He voices understanding of the plan and reasons to return.  All questions answered.  Patient stable for discharge.  Steven Schneider was evaluated in Emergency Department on 12/08/2019 for the symptoms described in the history of present illness. He was evaluated in the context of the global COVID-19 pandemic, which necessitated consideration that the patient might be at risk for infection with the SARS-CoV-2 virus that causes COVID-19. Institutional protocols and algorithms that pertain to the evaluation of patients at risk for COVID-19 are in a state of rapid change based on information released by regulatory bodies including the CDC and federal and state organizations. These policies and algorithms were followed during the patient's care in the ED.   Final Clinical Impression(s) / ED Diagnoses Final diagnoses:  [id number]    Rx / DC Orders ED Discharge Orders         Ordered    ondansetron (ZOFRAN) 4 MG tablet  Every 6 hours        12/08/19 1704    benzonatate (TESSALON) 100 MG capsule  Every 8 hours        12/08/19 1704           Shameeka Silliman S, PA-C 12/08/19 1709    Blanchie Dessert, MD 12/11/19 1927

## 2019-12-08 NOTE — ED Notes (Signed)
Ambulated pt in room, SpO2 maintained at 94-95%, heart rate increased to 120, pt became slightly tachypneic, however states he felt fine ambulating. Provided pt with ice pop per request for PO challenge

## 2019-12-08 NOTE — Discharge Instructions (Signed)
You were diagnosed with a coronavirus today.  You are given some medications to help with your symptoms including cough medication and nausea medication.  Take as directed.  Make sure to stay well-hydrated and get plenty of sleep.  Please follow-up with your regular doctor in the next 3 to 5 days for reassessment and return to the emergency department for any new or worsening symptoms.

## 2019-12-08 NOTE — ED Triage Notes (Signed)
Per EMS:  Weakness, nausea and diarrhea for several days.  No known covid exposure.  No respiratory distress.  Pt has had 250 cc NS bolus and Zofran 4 mg IVP.  Has not had any vaccinations.

## 2019-12-09 ENCOUNTER — Telehealth: Payer: Self-pay | Admitting: Nurse Practitioner

## 2019-12-09 NOTE — Telephone Encounter (Signed)
Called to discuss with Steven Schneider about Covid symptoms and the use of casirivimab/imdevimab, a combination monoclonal antibody infusion for those with mild to moderate Covid symptoms and at a high risk of hospitalization.     Pt is qualified for this infusion at the [facility] infusion center due to co-morbid conditions (as indicated below) and/or a member of an at-risk group, however declines infusion at this time. Symptoms tier reviewed as well as criteria for ending isolation.  Symptoms reviewed that would warrant ED/Hospital evaluation. Preventative practices reviewed. Patient verbalized understanding. Patient advised to call back if he decides that he does want to get infusion.   Reports symptoms are much better and he is almost feeling back to normal today.     Patient Active Problem List   Diagnosis Date Noted  . Severe obesity (BMI 35.0-39.9) with comorbidity ([place]) 09/15/2019  . Primary osteoarthritis of right knee 03/06/2018  . History of total knee replacement, left 02/16/2018  . Hyperlipidemia associated with type 2 diabetes mellitus ([place]) 08/19/2016  . Benign essential HTN 11/14/2014  . Obstructive sleep apnea syndrome, moderate 08/23/2014  . Male-to-male transgender person 04/04/2014  . Type 2 diabetes mellitus with hyperglycemia, without long-term current use of insulin ([place]) 07/17/2011    Steven Schneider, AGPCNP-BC

## 2021-02-14 ENCOUNTER — Ambulatory Visit: Payer: BC Managed Care – PPO | Admitting: Dermatology

## 2021-03-22 IMAGING — DX DG CHEST 1V PORT
1 series · 1 of 1 positions shown · non-contrast
Comparison: Chest radiograph dated 01/06/2014.

CLINICAL DATA: 69-year-old male with cough and weakness.

EXAM:
PORTABLE CHEST 1 VIEW

[chest ap]
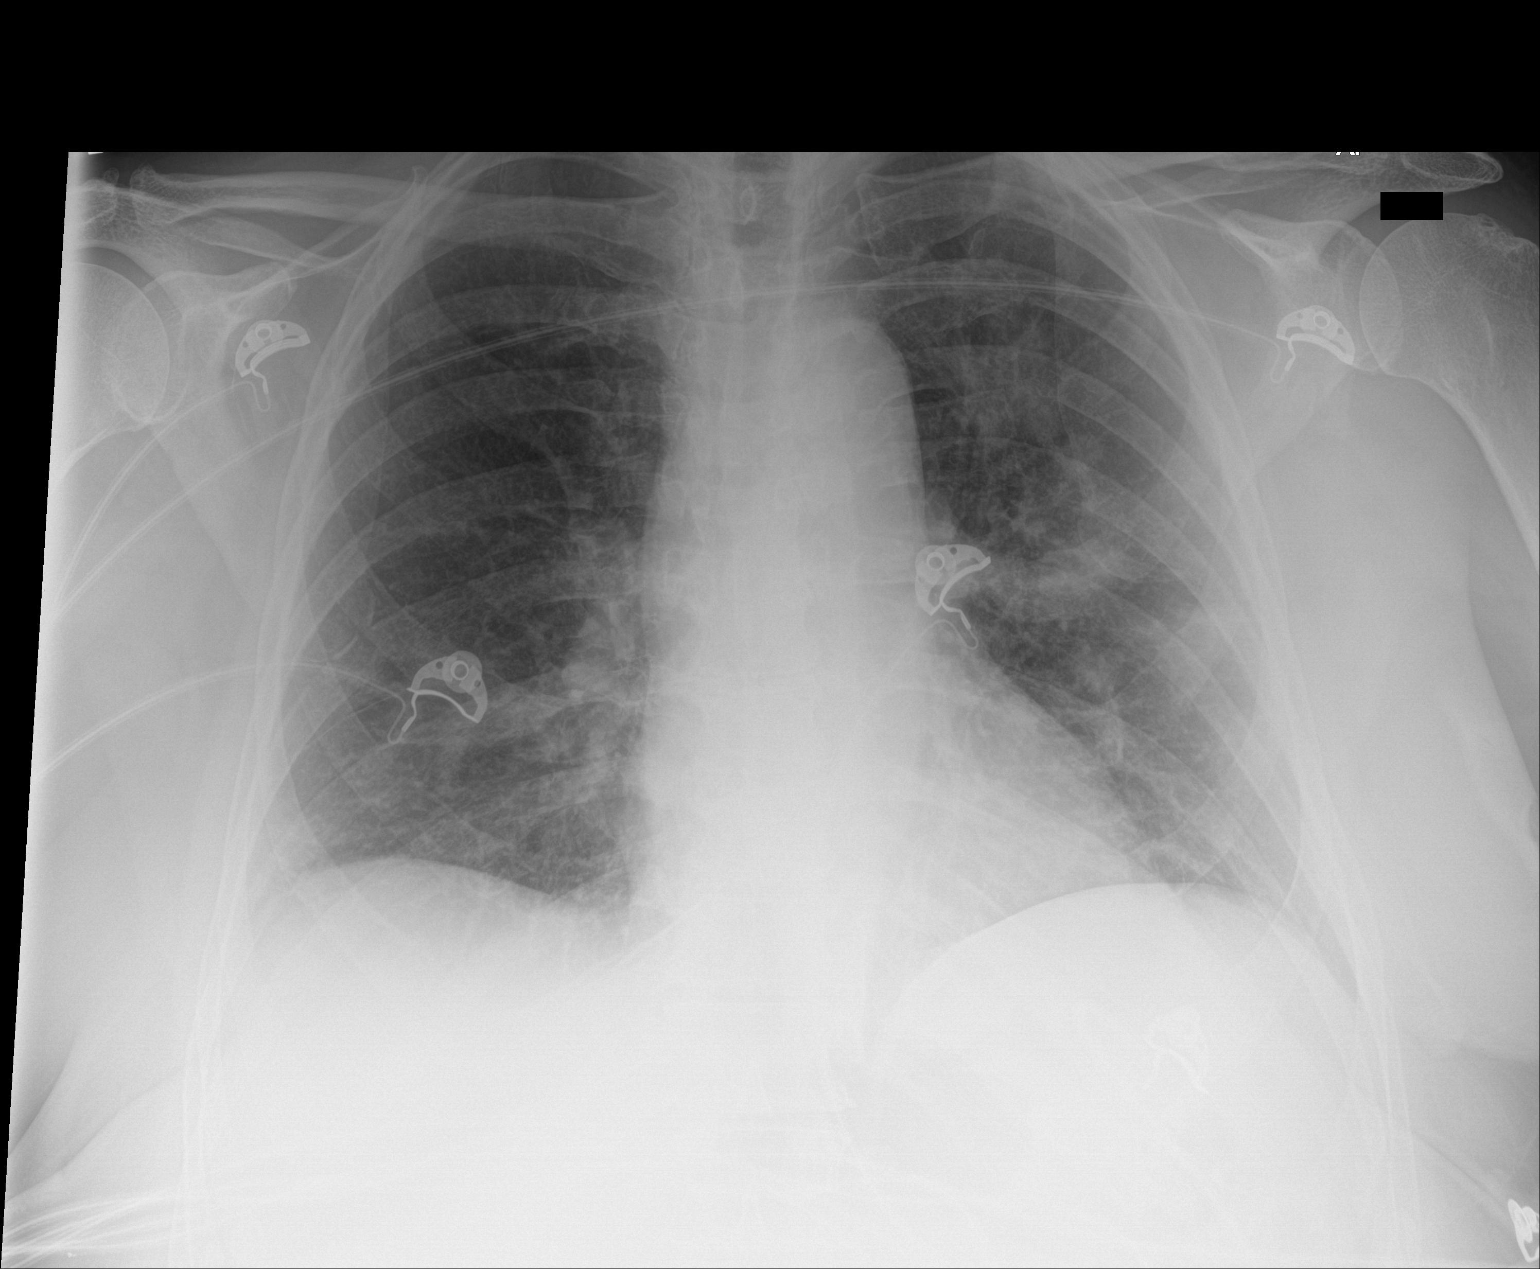

[1 of 1 positions shown; findings below may reference images not displayed]

FINDINGS: Faint streaky and confluent densities in the left lung and right
lung base most concerning for infiltrate. Clinical correlation is
recommended. No lobar consolidation, pleural effusion, or
pneumothorax. The cardiac silhouette is within limits. No acute
osseous pathology.
IMPRESSION: Findings concerning for developing infiltrates bilaterally.

## 2022-07-08 ENCOUNTER — Other Ambulatory Visit: Payer: Self-pay

## 2022-07-08 ENCOUNTER — Emergency Department (HOSPITAL_COMMUNITY): Payer: BC Managed Care – PPO

## 2022-07-08 ENCOUNTER — Encounter (HOSPITAL_COMMUNITY): Payer: Self-pay

## 2022-07-08 ENCOUNTER — Emergency Department (HOSPITAL_COMMUNITY)
Admission: EM | Admit: 2022-07-08 | Discharge: 2022-07-08 | Disposition: A | Discharge status: Home / Self Care | Payer: BC Managed Care – PPO | Attending: Emergency Medicine | Admitting: Emergency Medicine

## 2022-07-08 DIAGNOSIS — K5730 Diverticulosis of large intestine without perforation or abscess without bleeding: Secondary | ICD-10-CM | POA: Diagnosis not present

## 2022-07-08 DIAGNOSIS — K8020 Calculus of gallbladder without cholecystitis without obstruction: Secondary | ICD-10-CM | POA: Diagnosis not present

## 2022-07-08 DIAGNOSIS — D1779 Benign lipomatous neoplasm of other sites: Secondary | ICD-10-CM | POA: Insufficient documentation

## 2022-07-08 DIAGNOSIS — K2900 Acute gastritis without bleeding: Secondary | ICD-10-CM | POA: Diagnosis not present

## 2022-07-08 DIAGNOSIS — R112 Nausea with vomiting, unspecified: Secondary | ICD-10-CM | POA: Diagnosis present

## 2022-07-08 DIAGNOSIS — F1290 Cannabis use, unspecified, uncomplicated: Secondary | ICD-10-CM | POA: Insufficient documentation

## 2022-07-08 DIAGNOSIS — N200 Calculus of kidney: Secondary | ICD-10-CM | POA: Diagnosis not present

## 2022-07-08 DIAGNOSIS — K449 Diaphragmatic hernia without obstruction or gangrene: Secondary | ICD-10-CM | POA: Diagnosis not present

## 2022-07-08 DIAGNOSIS — K760 Fatty (change of) liver, not elsewhere classified: Secondary | ICD-10-CM | POA: Insufficient documentation

## 2022-07-08 LAB — URINALYSIS, ROUTINE W REFLEX MICROSCOPIC
Bacteria, UA: NONE SEEN
Bilirubin Urine: NEGATIVE
Glucose, UA: 150 mg/dL — AB
Hgb urine dipstick: NEGATIVE
Ketones, ur: 80 mg/dL — AB
Nitrite: NEGATIVE
Protein, ur: 30 mg/dL — AB
Specific Gravity, Urine: >1.046 — ABNORMAL HIGH (ref 1.005–1.030)
pH: 6 (ref 5.0–8.0)

## 2022-07-08 LAB — CBC WITH DIFFERENTIAL/PLATELET
Abs Immature Granulocytes: 0.06 10*3/uL (ref 0.00–0.07)
Basophils Absolute: 0 10*3/uL (ref 0.0–0.1)
Basophils Relative: 0 %
Eosinophils Absolute: 0 10*3/uL (ref 0.0–0.5)
Eosinophils Relative: 0 %
HCT: 47 % (ref 39.0–52.0)
Hemoglobin: 16 g/dL (ref 13.0–17.0)
Immature Granulocytes: 0 %
Lymphocytes Relative: 10 %
Lymphs Abs: 1.4 10*3/uL (ref 0.7–4.0)
MCH: 31.2 pg (ref 26.0–34.0)
MCHC: 34 g/dL (ref 30.0–36.0)
MCV: 91.6 fL (ref 80.0–100.0)
Monocytes Absolute: 1.2 10*3/uL — ABNORMAL HIGH (ref 0.1–1.0)
Monocytes Relative: 8 %
Neutro Abs: 11.8 10*3/uL — ABNORMAL HIGH (ref 1.7–7.7)
Neutrophils Relative %: 82 %
Platelets: 234 10*3/uL (ref 150–400)
RBC: 5.13 MIL/uL (ref 4.22–5.81)
RDW: 12.7 % (ref 11.5–15.5)
WBC: 14.5 10*3/uL — ABNORMAL HIGH (ref 4.0–10.5)
nRBC: 0 % (ref 0.0–0.2)

## 2022-07-08 LAB — COMPREHENSIVE METABOLIC PANEL
ALT: 28 U/L (ref 0–44)
AST: 41 U/L (ref 15–41)
Albumin: 4.1 g/dL (ref 3.5–5.0)
Alkaline Phosphatase: 50 U/L (ref 38–126)
Anion gap: 11 (ref 5–15)
BUN: 16 mg/dL (ref 8–23)
CO2: 24 mmol/L (ref 22–32)
Calcium: 9.4 mg/dL (ref 8.9–10.3)
Chloride: 103 mmol/L (ref 98–111)
Creatinine, Ser: 0.93 mg/dL (ref 0.61–1.24)
GFR, Estimated: >60 mL/min (ref >60)
Glucose, Bld: 206 mg/dL — ABNORMAL HIGH (ref 70–99)
Potassium: 3.2 mmol/L — ABNORMAL LOW (ref 3.5–5.1)
Sodium: 138 mmol/L (ref 135–145)
Total Bilirubin: 1.4 mg/dL — ABNORMAL HIGH (ref 0.3–1.2)
Total Protein: 7.4 g/dL (ref 6.5–8.1)

## 2022-07-08 LAB — RAPID URINE DRUG SCREEN, HOSP PERFORMED
Amphetamines: NOT DETECTED
Barbiturates: NOT DETECTED
Benzodiazepines: NOT DETECTED
Cocaine: NOT DETECTED
Opiates: NOT DETECTED
Tetrahydrocannabinol: POSITIVE — AB

## 2022-07-08 LAB — LIPASE, BLOOD: Lipase: 32 U/L (ref 11–51)

## 2022-07-08 MED ORDER — SODIUM CHLORIDE (PF) 0.9 % IJ SOLN
INTRAMUSCULAR | Status: AC
  Filled 2022-07-08: qty 50

## 2022-07-08 MED ORDER — SODIUM CHLORIDE 0.9 % IV SOLN: INTRAVENOUS | Status: DC

## 2022-07-08 MED ORDER — FENTANYL CITRATE PF 50 MCG/ML IJ SOSY
50.0000 ug | PREFILLED_SYRINGE | Freq: Once | INTRAMUSCULAR | Status: AC
  Administered 2022-07-08: 50 ug via INTRAVENOUS
  Filled 2022-07-08: qty 1

## 2022-07-08 MED ORDER — ALUM & MAG HYDROXIDE-SIMETH 200-200-20 MG/5ML PO SUSP
30.0000 mL | Freq: Once | ORAL | Status: AC
  Administered 2022-07-08: 30 mL via ORAL
  Filled 2022-07-08: qty 30

## 2022-07-08 MED ORDER — FAMOTIDINE IN NACL 20-0.9 MG/50ML-% IV SOLN
20.0000 mg | Freq: Once | INTRAVENOUS | Status: AC
  Administered 2022-07-08: 20 mg via INTRAVENOUS
  Filled 2022-07-08: qty 50

## 2022-07-08 MED ORDER — LIDOCAINE VISCOUS HCL 2 % MT SOLN
15.0000 mL | Freq: Once | OROMUCOSAL | Status: AC
  Administered 2022-07-08: 15 mL via ORAL
  Filled 2022-07-08: qty 15

## 2022-07-08 MED ORDER — IOHEXOL 300 MG/ML  SOLN
100.0000 mL | Freq: Once | INTRAMUSCULAR | Status: AC | PRN
  Administered 2022-07-08: 100 mL via INTRAVENOUS

## 2022-07-08 MED ORDER — ONDANSETRON 4 MG PO TBDP: 4.0000 mg | ORAL_TABLET | Freq: Three times a day (TID) | ORAL | 0 refills | Status: AC | PRN

## 2022-07-08 MED ORDER — DROPERIDOL 2.5 MG/ML IJ SOLN
2.5000 mg | Freq: Once | INTRAMUSCULAR | Status: AC
  Administered 2022-07-08: 2.5 mg via INTRAVENOUS
  Filled 2022-07-08: qty 2

## 2022-07-08 MED ORDER — ONDANSETRON HCL 4 MG/2ML IJ SOLN
4.0000 mg | Freq: Once | INTRAMUSCULAR | Status: AC
  Administered 2022-07-08: 4 mg via INTRAVENOUS
  Filled 2022-07-08: qty 2

## 2022-07-08 MED ORDER — SODIUM CHLORIDE 0.9 % IV BOLUS
1000.0000 mL | Freq: Once | INTRAVENOUS | Status: AC
  Administered 2022-07-08: 1000 mL via INTRAVENOUS

## 2022-07-08 NOTE — ED Triage Notes (Signed)
Patient brought in by EMS due to nausea and vomiting X 3 days. Reports due to not eating or drinking has been getting lightheaded and dizzy with standing. Received 4mg  zofran and NS in route via EMS.

## 2022-07-08 NOTE — ED Notes (Signed)
Pt notified to give a urine sample when possible.

## 2022-07-08 NOTE — ED Provider Notes (Signed)
[place] EMERGENCY DEPARTMENT AT [facility] Provider Note   CSN: [id number] Arrival date & time: 07/08/22  1000     History  Chief Complaint  Patient presents with   Nausea   Emesis    Steven Schneider is a 72 y.o. male.   Emesis Associated symptoms: abdominal pain      72 year old male presenting to the emergency department with abdominal pain, nausea and vomiting.  The patient states that for the last 3 days he has been able to keep anything down.  His last bowel movement was 3 days ago and he is no longer passing gas.  He endorses persistent nausea, endorses NBNB emesis.  No rectal pain bleeding.  He endorses chills.  Does have a history of kidney stones requiring lithotripsy.  No specific flank pain.  Endorses mild pain in the right lower quadrant. He does utilize CBD products.   Home Medications Prior to Admission medications   Medication Sig Start Date End Date Taking? Authorizing Provider  DIGESTIVE ENZYMES PO Take by mouth.    [provider]  Omega 3-6-9 Fatty Acids (OMEGA 3-6-9 PO) Take by mouth.    [provider]  ondansetron (ZOFRAN) 4 MG tablet Take 1 tablet (4 mg total) by mouth every 6 (six) hours. 12/08/19   Couture, Cortni S, PA-C  Polyethyl Glycol-Propyl Glycol (SYSTANE OP) Place 2 drops into both eyes 3 (three) times daily as needed. For dry eyes    [provider]  sennosides-docusate sodium (SENOKOT-S) 8.6-50 MG tablet Take 1 tablet by mouth daily. 03/30/12   Pisciotta, Joni Reining, PA-C  triamcinolone cream (KENALOG) 0.1 % Apply 1 application topically 2 (two) times daily as needed. 10/27/19   Janalyn Harder, MD  Vitamin D, Ergocalciferol, (DRISDOL) 50000 UNITS CAPS capsule Take 50,000 Units by mouth every 7 (seven) days.    [provider]      Allergies    Ibuprofen    Review of Systems   Review of Systems  Gastrointestinal:  Positive for abdominal pain, nausea and vomiting.  All other systems reviewed and are  negative.   Physical Exam Updated Vital Signs BP (!) 165/95   Pulse 96   Temp 99.2 F (37.3 C) (Oral)   Resp (!) 21   Ht 5\' 8"  (1.727 m)   Wt 98.9 kg   SpO2 95%   BMI 33.15 kg/m  Physical Exam Vitals and nursing note reviewed.  Constitutional:      General: He is not in acute distress.    Appearance: He is well-developed. He is obese.  HENT:     Head: Normocephalic and atraumatic.  Eyes:     Conjunctiva/sclera: Conjunctivae normal.  Cardiovascular:     Rate and Rhythm: Normal rate and regular rhythm.     Heart sounds: No murmur heard. Pulmonary:     Effort: Pulmonary effort is normal. No respiratory distress.     Breath sounds: Normal breath sounds.  Abdominal:     Palpations: Abdomen is soft.     Tenderness: There is abdominal tenderness in the right lower quadrant. There is no guarding or rebound.  Musculoskeletal:        General: No swelling.     Cervical back: Neck supple.  Skin:    General: Skin is warm and dry.     Capillary Refill: Capillary refill takes less than 2 seconds.  Neurological:     Mental Status: He is alert.  Psychiatric:        Mood  and Affect: Mood normal.     ED Results / Procedures / Treatments   Labs (all labs ordered are listed, but only abnormal results are displayed) Labs Reviewed  COMPREHENSIVE METABOLIC PANEL - Abnormal; Notable for the following components:      Result Value   Potassium 3.2 (*)    Glucose, Bld 206 (*)    Total Bilirubin 1.4 (*)    All other components within normal limits  CBC WITH DIFFERENTIAL/PLATELET - Abnormal; Notable for the following components:   WBC 14.5 (*)    Neutro Abs 11.8 (*)    Monocytes Absolute 1.2 (*)    All other components within normal limits  URINALYSIS, ROUTINE W REFLEX MICROSCOPIC - Abnormal; Notable for the following components:   Specific Gravity, Urine >1.046 (*)    Glucose, UA 150 (*)    Ketones, ur 80 (*)    Protein, ur 30 (*)    Leukocytes,Ua TRACE (*)    All other  components within normal limits  RAPID URINE DRUG SCREEN, HOSP PERFORMED - Abnormal; Notable for the following components:   Tetrahydrocannabinol POSITIVE (*)    All other components within normal limits  LIPASE, BLOOD    EKG EKG Interpretation  Date/Time:  Monday Jul 08 2022 11:13:48 EDT Ventricular Rate:  78 PR Interval:  144 QRS Duration: 84 QT Interval:  385 QTC Calculation: 439 R Axis:   7 Text Interpretation: Sinus rhythm Confirmed by Ernie Avena ([id number]) on 07/08/2022 11:57:51 AM  Radiology CT ABDOMEN PELVIS W CONTRAST  Result Date: 07/08/2022 CLINICAL DATA:  Nausea and vomiting for 3 days with a abdominal pain. EXAM: CT ABDOMEN AND PELVIS WITH CONTRAST TECHNIQUE: Multidetector CT imaging of the abdomen and pelvis was performed using the standard protocol following bolus administration of intravenous contrast. RADIATION DOSE REDUCTION: This exam was performed according to the departmental dose-optimization program which includes automated exposure control, adjustment of the mA and/or kV according to patient size and/or use of iterative reconstruction technique. CONTRAST:  OMNIPAQUE IOHEXOL 300 MG/ML  SOLN COMPARISON:  CT 124 2018 and older FINDINGS: Lower chest: Breathing motion at the lung bases. There is some linear opacity at the bases likely scar or atelectasis. Small hiatal hernia. Hepatobiliary: Fatty liver infiltration identified. Calcification in the right hepatic lobe on series 2, image 21 consistent with old granulomatous disease or other infectious or inflammatory chronic process. Stable from previous exam. Patent portal vein. Gallbladder is present. Small gallstone. Pancreas: Unremarkable. No pancreatic ductal dilatation or surrounding inflammatory changes. Spleen: Normal in size without focal abnormality. Adrenals/Urinary Tract: The right adrenal gland is preserved. The left has a focal area of macroscopic fat measuring 13 mm along the lateral limb consistent with a  myelolipoma. No enhancing renal mass. Multiple left-sided parapelvic renal cysts. Lower pole right-sided nonobstructing stone. This somewhat staghorn like measuring up to 12 mm on coronal imaging. The ureters have normal course and caliber down to the bladder. Preserved contours of the urinary bladder. Stomach/Bowel: Diffuse colonic diverticulosis seen particularly of the descending and sigmoid colon. Scattered stool. No obstruction. Normal appendix extends posterior and inferior to the cecum in the right hemipelvis. On this non oral contrast exam the stomach and small bowel are nondilated. Vascular/Lymphatic: Aortic atherosclerosis. No enlarged abdominal or pelvic lymph nodes. Reproductive: Prostate is unremarkable. Other: No abdominal wall hernia or abnormality. No abdominopelvic ascites. Stable mesenteric calcification in the central pelvis, possibly a calcified lymph node. Musculoskeletal: Moderate degenerative changes of the spine greater than pelvis. Slightly heterogeneous appearance  to the bones with some areas of sclerosis such as right iliac bone posteriorly such as series 2, image 58 and patchy areas along the spine but these areas were seen on the previous examination. Please correlate for chronic process. IMPRESSION: Colonic diverticulosis. Normal appendix. No obstruction or free air. Fatty liver infiltration. Small hiatal hernia. Lower pole right-sided nonobstructing renal stone. Left adrenal myelolipoma. Scattered bony sclerosis identified along the spine and pelvis, unchanged however from the study of 2018, chronic process. Gallstones in the nondilated gallbladder. Electronically Signed   By: Karen Kays M.D.   On: 07/08/2022 12:26    Procedures Procedures    Medications Ordered in ED Medications  sodium chloride 0.9 % bolus 1,000 mL (0 mLs Intravenous Stopped 07/08/22 1143)    And  0.9 %  sodium chloride infusion ( Intravenous New Bag/Given 07/08/22 1206)  ondansetron (ZOFRAN) injection 4  mg (4 mg Intravenous Given 07/08/22 1215)  iohexol (OMNIPAQUE) 300 MG/ML solution 100 mL (100 mLs Intravenous Contrast Given 07/08/22 1151)  fentaNYL (SUBLIMAZE) injection 50 mcg (50 mcg Intravenous Given 07/08/22 1205)  droperidol (INAPSINE) 2.5 MG/ML injection 2.5 mg (2.5 mg Intravenous Given 07/08/22 1321)  famotidine (PEPCID) IVPB 20 mg premix (0 mg Intravenous Stopped 07/08/22 1352)  alum & mag hydroxide-simeth (MAALOX/MYLANTA) 200-200-20 MG/5ML suspension 30 mL (30 mLs Oral Given 07/08/22 1321)    And  lidocaine (XYLOCAINE) 2 % viscous mouth solution 15 mL (15 mLs Oral Given 07/08/22 1322)    ED Course/ Medical Decision Making/ A&P Clinical Course as of 07/08/22 1435  Mon Jul 08, 2022  1301 Tetrahydrocannabinol(!): POSITIVE [JL]  1410 WBC(!): 14.5 [JL]    Clinical Course User Index [JL] Ernie Avena, MD                             Medical Decision Making Amount and/or Complexity of Data Reviewed Labs: ordered. Decision-making details documented in ED Course. Radiology: ordered.  Risk OTC drugs. Prescription drug management.    72 year old male presenting to the emergency department with abdominal pain, nausea and vomiting.  The patient states that for the last 3 days he has been able to keep anything down.  His last bowel movement was 3 days ago and he is no longer passing gas.  He endorses persistent nausea, endorses NBNB emesis.  No rectal pain bleeding.  He endorses chills.  Does have a history of kidney stones requiring lithotripsy.  No specific flank pain.  Endorses mild pain in the right lower quadrant.  Medical Decision Making:   Steven Schneider is a 72 y.o. male who presented to the ED today with abdominal pain, detailed above.    Patient placed on continuous vitals and telemetry monitoring while in ED which was reviewed periodically.  Complete initial physical exam performed, notably the patient  was tender in the right lower quadrant.     Reviewed and confirmed  nursing documentation for past medical history, family history, social history.    Initial Assessment:   With the patient's presentation of abdominal pain, most likely diagnosis is SBO vs constipation vs viral syndrome. Other diagnoses were considered including (but not limited to) gastroenteritis, colitis, appendicitis, cholecystitis, pancreatitis, nephrolithiasis, UTI, pyleonephritis. These are considered less likely due to history of present illness and physical exam findings.   This is most consistent with an acute complicated illness   Initial Plan:  CBC/CMP to evaluate for underlying infectious/metabolic etiology for patient's abdominal pain  Lipase to  evaluate for pancreatitis  EKG to evaluate for cardiac source of pain  CT AB/Pelvis with contrast to evaluate for structural/surgical etiology of patients' severe abdominal pain.  Urinalysis and repeat physical assessment to evaluate for UTI/Pyelonpehritis  Empiric management of symptoms with escalating pain control and antiemetics as needed.   Initial Study Results:   Laboratory  All laboratory results reviewed without evidence of clinically relevant pathology.   Exceptions include: Leukocytosis to 14.5, mild hypokalemia to 3.2.   EKG was reviewed independently. Rate, rhythm, axis, intervals all examined and without medically relevant abnormality. ST segments without concerns for elevations.    Radiology All images reviewed independently. Agree with radiology report at this time.   CT ABDOMEN PELVIS W CONTRAST  Result Date: 07/08/2022 CLINICAL DATA:  Nausea and vomiting for 3 days with a abdominal pain. EXAM: CT ABDOMEN AND PELVIS WITH CONTRAST TECHNIQUE: Multidetector CT imaging of the abdomen and pelvis was performed using the standard protocol following bolus administration of intravenous contrast. RADIATION DOSE REDUCTION: This exam was performed according to the departmental dose-optimization program which includes automated  exposure control, adjustment of the mA and/or kV according to patient size and/or use of iterative reconstruction technique. CONTRAST:  OMNIPAQUE IOHEXOL 300 MG/ML  SOLN COMPARISON:  CT 124 2018 and older FINDINGS: Lower chest: Breathing motion at the lung bases. There is some linear opacity at the bases likely scar or atelectasis. Small hiatal hernia. Hepatobiliary: Fatty liver infiltration identified. Calcification in the right hepatic lobe on series 2, image 21 consistent with old granulomatous disease or other infectious or inflammatory chronic process. Stable from previous exam. Patent portal vein. Gallbladder is present. Small gallstone. Pancreas: Unremarkable. No pancreatic ductal dilatation or surrounding inflammatory changes. Spleen: Normal in size without focal abnormality. Adrenals/Urinary Tract: The right adrenal gland is preserved. The left has a focal area of macroscopic fat measuring 13 mm along the lateral limb consistent with a myelolipoma. No enhancing renal mass. Multiple left-sided parapelvic renal cysts. Lower pole right-sided nonobstructing stone. This somewhat staghorn like measuring up to 12 mm on coronal imaging. The ureters have normal course and caliber down to the bladder. Preserved contours of the urinary bladder. Stomach/Bowel: Diffuse colonic diverticulosis seen particularly of the descending and sigmoid colon. Scattered stool. No obstruction. Normal appendix extends posterior and inferior to the cecum in the right hemipelvis. On this non oral contrast exam the stomach and small bowel are nondilated. Vascular/Lymphatic: Aortic atherosclerosis. No enlarged abdominal or pelvic lymph nodes. Reproductive: Prostate is unremarkable. Other: No abdominal wall hernia or abnormality. No abdominopelvic ascites. Stable mesenteric calcification in the central pelvis, possibly a calcified lymph node. Musculoskeletal: Moderate degenerative changes of the spine greater than pelvis. Slightly  heterogeneous appearance to the bones with some areas of sclerosis such as right iliac bone posteriorly such as series 2, image 58 and patchy areas along the spine but these areas were seen on the previous examination. Please correlate for chronic process. IMPRESSION: Colonic diverticulosis. Normal appendix. No obstruction or free air. Fatty liver infiltration. Small hiatal hernia. Lower pole right-sided nonobstructing renal stone. Left adrenal myelolipoma. Scattered bony sclerosis identified along the spine and pelvis, unchanged however from the study of 2018, chronic process. Gallstones in the nondilated gallbladder. Electronically Signed   By: Karen Kays M.D.   On: 07/08/2022 12:26     Final Reassessment and Plan:   The patient CT imaging is overall reassuring.  Symptoms could be consistent with gastritis and developing viral syndrome.  No evidence of bowel  obstruction, nephrolithiasis noted on the right.  Discussed the finding on CT imaging with Dr. Liliane Shi who agreed outpatient follow-up is indicated however no acute intervention needed.  No evidence of bowel obstruction, no evidence of incarcerated hernia.  No evidence of diverticulitis.  Urinalysis without evidence of UTI and the remainder of laboratory evaluation reassuring.  Patient feeling symptomatically improved following droperidol, Maalox and lidocaine and IV Pepcid.  Advise cessation of CBD products. Advised PCP follow-up in the interim, also advised to follow-up outpatient with urology and gastroenterology.  Tolerating oral intake on repeat assessment, overall stable for discharge at this time.     Final Clinical Impression(s) / ED Diagnoses Final diagnoses:  Nausea and vomiting, unspecified vomiting type  Acute gastritis, presence of bleeding unspecified, unspecified gastritis type  Kidney stone    Rx / DC Orders ED Discharge Orders          Ordered    Ambulatory referral to Gastroenterology        07/08/22 1429               Ernie Avena, MD 07/08/22 1435

## 2022-07-08 NOTE — Discharge Instructions (Addendum)
Your laboratory evaluation and CT imaging was reassuring.  Recommend over-the-counter Maalox, will provide Zofran for nausea control.  Follow-up outpatient with gastroenterology.  Recommend cessation of any CBD products in the interim.  Your CT revealed a kidney stone on the right which warrants follow-up with urology: IMPRESSION:  Colonic diverticulosis. Normal appendix. No obstruction or free air.    Fatty liver infiltration.    Small hiatal hernia.    Lower pole right-sided nonobstructing renal stone.    Left adrenal myelolipoma.    Scattered bony sclerosis identified along the spine and pelvis,  unchanged however from the study of 2018, chronic process.    Gallstones in the nondilated gallbladder.

## 2024-01-09 ENCOUNTER — Other Ambulatory Visit (HOSPITAL_BASED_OUTPATIENT_CLINIC_OR_DEPARTMENT_OTHER): Payer: Self-pay | Admitting: Family Medicine

## 2024-01-09 DIAGNOSIS — M899 Disorder of bone, unspecified: Secondary | ICD-10-CM

## 2024-01-09 DIAGNOSIS — N202 Calculus of kidney with calculus of ureter: Secondary | ICD-10-CM

## 2024-01-09 DIAGNOSIS — E1165 Type 2 diabetes mellitus with hyperglycemia: Secondary | ICD-10-CM

## 2024-01-09 DIAGNOSIS — M810 Age-related osteoporosis without current pathological fracture: Secondary | ICD-10-CM

## 2024-01-16 ENCOUNTER — Ambulatory Visit (HOSPITAL_BASED_OUTPATIENT_CLINIC_OR_DEPARTMENT_OTHER)
Admission: RE | Admit: 2024-01-16 | Discharge: 2024-01-16 | Disposition: A | Discharge status: Home / Self Care | Source: Ambulatory Visit | Attending: Family Medicine | Admitting: Family Medicine

## 2024-01-16 DIAGNOSIS — N202 Calculus of kidney with calculus of ureter: Secondary | ICD-10-CM | POA: Insufficient documentation

## 2024-01-16 DIAGNOSIS — E1165 Type 2 diabetes mellitus with hyperglycemia: Secondary | ICD-10-CM | POA: Diagnosis present

## 2024-01-16 DIAGNOSIS — M810 Age-related osteoporosis without current pathological fracture: Secondary | ICD-10-CM | POA: Insufficient documentation

## 2024-01-16 DIAGNOSIS — M899 Disorder of bone, unspecified: Secondary | ICD-10-CM | POA: Insufficient documentation
